# Patient Record
Sex: Female | Born: 1975 | Marital: Married | Smoking: Never smoker
Health system: Southern US, Community
[De-identification: ages and names within clinical notes are randomized; demographics above are authoritative.]

## PROBLEM LIST (undated history)

## (undated) DIAGNOSIS — M549 Dorsalgia, unspecified: Secondary | ICD-10-CM

## (undated) DIAGNOSIS — F32A Depression, unspecified: Secondary | ICD-10-CM

## (undated) DIAGNOSIS — R51 Headache: Secondary | ICD-10-CM

## (undated) DIAGNOSIS — R519 Headache, unspecified: Secondary | ICD-10-CM

## (undated) DIAGNOSIS — F329 Major depressive disorder, single episode, unspecified: Secondary | ICD-10-CM

## (undated) HISTORY — DX: Headache, unspecified: R51.9

## (undated) HISTORY — DX: Depression, unspecified: F32.A

## (undated) HISTORY — DX: Major depressive disorder, single episode, unspecified: F32.9

## (undated) HISTORY — DX: Dorsalgia, unspecified: M54.9

## (undated) HISTORY — PX: ABDOMINAL HYSTERECTOMY: SHX81

## (undated) HISTORY — DX: Headache: R51

---

## 2011-06-10 ENCOUNTER — Ambulatory Visit (INDEPENDENT_AMBULATORY_CARE_PROVIDER_SITE_OTHER): Payer: BC Managed Care – PPO | Admitting: Medical

## 2011-06-10 ENCOUNTER — Encounter: Payer: Self-pay | Admitting: Medical

## 2011-06-10 ENCOUNTER — Other Ambulatory Visit (HOSPITAL_COMMUNITY)
Admission: RE | Admit: 2011-06-10 | Discharge: 2011-06-10 | Disposition: A | Discharge status: Home / Self Care | Payer: BC Managed Care – PPO | Source: Ambulatory Visit | Attending: Family Medicine | Admitting: Family Medicine

## 2011-06-10 VITALS — BP 120/80 | HR 80 | Temp 98.8°F | Resp 16 | Ht <= 58 in | Wt 119.0 lb

## 2011-06-10 DIAGNOSIS — Z0000 Encounter for general adult medical examination without abnormal findings: Secondary | ICD-10-CM

## 2011-06-10 DIAGNOSIS — M549 Dorsalgia, unspecified: Secondary | ICD-10-CM

## 2011-06-10 DIAGNOSIS — Z113 Encounter for screening for infections with a predominantly sexual mode of transmission: Secondary | ICD-10-CM

## 2011-06-10 DIAGNOSIS — Z23 Encounter for immunization: Secondary | ICD-10-CM

## 2011-06-10 DIAGNOSIS — K029 Dental caries, unspecified: Secondary | ICD-10-CM | POA: Insufficient documentation

## 2011-06-10 DIAGNOSIS — Z01419 Encounter for gynecological examination (general) (routine) without abnormal findings: Secondary | ICD-10-CM | POA: Insufficient documentation

## 2011-06-10 DIAGNOSIS — Z124 Encounter for screening for malignant neoplasm of cervix: Secondary | ICD-10-CM

## 2011-06-10 LAB — POCT URINALYSIS DIPSTICK
Bilirubin, UA: NEGATIVE
Blood, UA: NEGATIVE
Nitrite, UA: NEGATIVE
Spec Grav, UA: <=1.005
Urobilinogen, UA: NEGATIVE
pH, UA: 7.5

## 2011-06-10 NOTE — Patient Instructions (Signed)
You were here today for a preventative care/primary care visit.  Your blood pressure was normal today.  We checked blood/labs today for cholesterol, thyroid, liver, kidney, blood counts, urine, pap smear for cervical cancer screening.  You will be due for a mammogram breast cancer screening at age 36 years old.  We will call you with lab results in a few days.  I recommend the following:  See a dentist soon for cavities and hygiene visit  Exercise most days of the week - walking, running, swimming, etc.  Eat a healthy low fat diet  Don't add salt to your food  Return yearly for a physical and preventative care  Check your breasts once monthly for lumps or things that don't seem normal  Consider taking a daily multivitamin  Try OTC Aleve twice daily as needed for back pain  Let me know if you have other questions

## 2011-06-11 ENCOUNTER — Encounter: Payer: Self-pay | Admitting: Medical

## 2011-06-11 LAB — LIPID PANEL
Cholesterol: 174 mg/dL (ref 0–200)
HDL: 47 mg/dL (ref >39)
LDL Cholesterol: 110 mg/dL — ABNORMAL HIGH (ref 0–99)
Total CHOL/HDL Ratio: 3.7 Ratio
Triglycerides: 87 mg/dL (ref <150)
VLDL: 17 mg/dL (ref 0–40)

## 2011-06-11 LAB — CBC WITH DIFFERENTIAL/PLATELET
Basophils Relative: 1 % (ref 0–1)
HCT: 40 % (ref 36.0–46.0)
Hemoglobin: 14 g/dL (ref 12.0–15.0)
Lymphocytes Relative: 25 % (ref 12–46)
Lymphs Abs: 2.6 10*3/uL (ref 0.7–4.0)
MCHC: 35 g/dL (ref 30.0–36.0)
Monocytes Absolute: 0.5 10*3/uL (ref 0.1–1.0)
Monocytes Relative: 5 % (ref 3–12)
Neutro Abs: 6.7 10*3/uL (ref 1.7–7.7)
Neutrophils Relative %: 64 % (ref 43–77)
RBC: 5.33 MIL/uL — ABNORMAL HIGH (ref 3.87–5.11)

## 2011-06-11 LAB — COMPREHENSIVE METABOLIC PANEL
AST: 15 U/L (ref 0–37)
Albumin: 4.4 g/dL (ref 3.5–5.2)
BUN: 9 mg/dL (ref 6–23)
Calcium: 9.3 mg/dL (ref 8.4–10.5)
Chloride: 103 mEq/L (ref 96–112)
Creat: 0.55 mg/dL (ref 0.50–1.10)
Glucose, Bld: 78 mg/dL (ref 70–99)
Potassium: 3.9 mEq/L (ref 3.5–5.3)

## 2011-06-11 LAB — GC/CHLAMYDIA PROBE AMP, URINE: Chlamydia, Swab/Urine, PCR: NEGATIVE

## 2011-06-11 NOTE — Progress Notes (Signed)
Subjective:   HPI  Gail Bruce is a 36 y.o. female who presents for a complete physical. She is a new patient today.  She is accompanied by 2 interpreters today.   One is a friend who performs translation services through their church.  Her name is Elenor Legato and was a Proofreader in [place].  The other is a female interpreter Tin Hwing also [place] and offers services through [employer].  Mrs. Hoagland does not speak english.  She is from [place], moved to [place] 5-6 years ago, and is not accustomed to primary/preventative care.  Back in [place], she only saw doctors if sick.  She had all of her children at home.  She has enjoyed good health. She did have a hysterectomy in the past, but thinks it was due to infection.  She has not seen a doctor since coming to the [place].  She had vaccines administered prior to immigrating to the [place].   Recent issues.  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  Hx/o chronic headaches, possibly migraines, but not been an issue of late.  She does note hx/o yeast infection, but no prior STDs.  She wants pap test, never had screening prior.  She did have elevated BP on a church sponsored health screen, but no hx/o HTN.  Reviewed their medical, surgical, family, social, medication, and allergy history and updated chart as appropriate.  Past Medical History  Diagnosis Date  . Chronic headache   . Back pain   . Depression     Past Surgical History  Procedure Date  . Abdominal hysterectomy     due to infection?    Family History  Problem Relation Age of Onset  . Other Mother     unknown  . Other Father     unknown    History   Social History  . Marital Status: Married    Spouse Name: N/A    Number of Children: N/A  . Years of Education: N/A   Occupational History  . homemaker    Social History Main Topics  . Smoking status: Never Smoker   . Smokeless tobacco: Not on file  . Alcohol Use: No  . Drug Use: No   . Sexually Active: Not on file   Other Topics Concern  . Not on file   Social History Narrative   Moved to [place] 2006?, husband works out of town, from [place], has 3 children ages 62yo, 11yo and 11 yo as of 05/2011.    No current outpatient prescriptions on file prior to visit.    No Known Allergies   Review of Systems Constitutional: denies fever, chills, sweats, unexpected weight change, anorexia, fatigue Allergy: negative; denies recent sneezing, itching, congestion Dermatology: denies changing moles, rash, lumps, new worrisome lesions ENT: no runny nose, ear pain, sore throat, hoarseness, sinus pain, teeth pain, tinnitus, hearing loss, epistaxis Cardiology: denies chest pain, palpitations, edema, orthopnea, paroxysmal nocturnal dyspnea Respiratory: denies cough, shortness of breath, dyspnea on exertion, wheezing, hemoptysis Gastroenterology: denies abdominal pain, nausea, vomiting, diarrhea, constipation, blood in stool, changes in bowel movement, dysphagia Hematology: denies bleeding or bruising problems Musculoskeletal: +intermittent low back pain; denies arthralgias, myalgias, joint swelling, neck pain, cramping, gait changes Ophthalmology: denies vision changes, eye redness, itching, discharge Urology: denies dysuria, difficulty urinating, hematuria, urinary frequency, urgency, incontinence Neurology: intermittent headache, but no weakness, tingling, numbness, speech abnormality, memory loss, falls, dizziness      Objective:   Physical Exam  Filed Vitals:   06/10/11 0922  BP: 120/80  Pulse: 80  Temp: 98.8 F (37.1 C)  Resp: 16    General appearance: alert, no distress, WD/WN, vietnamese female Skin: some mild solar keratosis of face, but otherwise no worrisome lesions HEENT: normocephalic, conjunctiva/corneas normal, sclerae anicteric, PERRLA, EOMi, nares patent, no discharge or erythema, pharynx normal Oral cavity: MMM, tongue normal, few upper and lower left  side molars in decay, moderate plaque and stain Neck: supple, no lymphadenopathy, no thyromegaly, no masses, normal ROM, no bruits Chest: non tender, normal shape and expansion Heart: RRR, normal S1, S2, no murmurs Lungs: CTA bilaterally, no wheezes, rhonchi, or rales Abdomen: +bs, soft, non tender, non distended, no masses, no hepatomegaly, no splenomegaly, no bruits Back: non tender, normal ROM, no scoliosis Musculoskeletal: upper extremities non tender, no obvious deformity, normal ROM throughout, lower extremities non tender, no obvious deformity, normal ROM throughout Extremities: no edema, no cyanosis, no clubbing Pulses: 2+ symmetric, upper and lower extremities, normal cap refill Neurological: alert, oriented x 3, CN2-12 intact, strength normal upper extremities and lower extremities, sensation normal throughout, DTRs 2+ throughout, no cerebellar signs, gait normal Psychiatric: normal affect, behavior normal, pleasant  Breast: nontender, no masses or lumps, no skin changes, no nipple discharge or inversion, no axillary lymphadenopathy Gyn: Normal external genitalia without lesions, vagina with normal mucosa, cervix without lesions, no cervical motion tenderness, no abnormal vaginal discharge.  Uterus and adnexa not enlarged, nontender, no masses.  Pap performed, swabs taken.  Assisted by PA student Aleya Hyderi.  Exam chaperoned by nurse. Rectal: deferred    Assessment and Plan :     Encounter Diagnoses  Name Primary?  . Routine general medical examination at a health care facility Yes  . Screening for cervical cancer   . Back pain   . Dental caries   . Screen for STD (sexually transmitted disease)   . Need for prophylactic vaccination and inoculation against influenza     Physical exam - discussed healthy lifestyle, diet, exercise, preventative care, vaccinations, and addressed their concerns.  Handout given.  Labs, pap, STD screening today.  Dental caries - advised she  establish with dentist soon.  Flu vaccine, vaccine counseling, and VIS today.  Back pain - advised stretching, routine exercise, Tylenol or Aleve OTC for pain.   She also gets massage for back pain.  At her request, we will mail lab results.

## 2011-06-16 ENCOUNTER — Encounter: Payer: Self-pay | Admitting: Medical

## 2014-08-21 ENCOUNTER — Ambulatory Visit (INDEPENDENT_AMBULATORY_CARE_PROVIDER_SITE_OTHER): Payer: 59 | Admitting: Urgent Care

## 2014-08-21 VITALS — BP 118/68 | HR 80 | Temp 98.5°F | Resp 18 | Ht <= 58 in | Wt 120.0 lb

## 2014-08-21 DIAGNOSIS — R319 Hematuria, unspecified: Secondary | ICD-10-CM | POA: Diagnosis not present

## 2014-08-21 DIAGNOSIS — R309 Painful micturition, unspecified: Secondary | ICD-10-CM | POA: Diagnosis not present

## 2014-08-21 DIAGNOSIS — R1030 Lower abdominal pain, unspecified: Secondary | ICD-10-CM

## 2014-08-21 DIAGNOSIS — N390 Urinary tract infection, site not specified: Secondary | ICD-10-CM | POA: Diagnosis not present

## 2014-08-21 LAB — POCT CBC
Granulocyte percent: 73.7 %G (ref 37–80)
HEMATOCRIT: 42.9 % (ref 37.7–47.9)
HEMOGLOBIN: 13.8 g/dL (ref 12.2–16.2)
LYMPH, POC: 2.4 (ref 0.6–3.4)
MCH: 25.6 pg — AB (ref 27–31.2)
MCHC: 32.2 g/dL (ref 31.8–35.4)
MCV: 79.5 fL — AB (ref 80–97)
MID (cbc): 0.7 (ref 0–0.9)
MPV: 7.1 fL (ref 0–99.8)
PLATELET COUNT, POC: 287 10*3/uL (ref 142–424)
POC GRANULOCYTE: 8.7 — AB (ref 2–6.9)
POC LYMPH PERCENT: 20.5 %L (ref 10–50)
POC MID %: 5.8 %M (ref 0–12)
RBC: 5.39 M/uL (ref 4.04–5.48)
RDW, POC: 15.6 %
WBC: 11.8 10*3/uL — AB (ref 4.6–10.2)

## 2014-08-21 LAB — POCT UA - MICROSCOPIC ONLY
CASTS, UR, LPF, POC: NEGATIVE
Crystals, Ur, HPF, POC: NEGATIVE
Mucus, UA: NEGATIVE
YEAST UA: NEGATIVE

## 2014-08-21 LAB — POCT URINALYSIS DIPSTICK
Bilirubin, UA: NEGATIVE
Glucose, UA: NEGATIVE
Ketones, UA: NEGATIVE
Nitrite, UA: NEGATIVE
Protein, UA: NEGATIVE
Spec Grav, UA: 1.02
Urobilinogen, UA: 0.2
pH, UA: 7

## 2014-08-21 LAB — COMPREHENSIVE METABOLIC PANEL
ALK PHOS: 51 U/L (ref 39–117)
AST: 12 U/L (ref 0–37)
Albumin: 4.2 g/dL (ref 3.5–5.2)
BILIRUBIN TOTAL: 0.5 mg/dL (ref 0.2–1.2)
BUN: 15 mg/dL (ref 6–23)
CO2: 25 mEq/L (ref 19–32)
Calcium: 9.2 mg/dL (ref 8.4–10.5)
Chloride: 102 mEq/L (ref 96–112)
Creat: 0.53 mg/dL (ref 0.50–1.10)
Glucose, Bld: 76 mg/dL (ref 70–99)
Potassium: 4.3 mEq/L (ref 3.5–5.3)
SODIUM: 134 meq/L — AB (ref 135–145)
Total Protein: 7.7 g/dL (ref 6.0–8.3)

## 2014-08-21 MED ORDER — PHENAZOPYRIDINE HCL 200 MG PO TABS: 200.0000 mg | ORAL_TABLET | Freq: Three times a day (TID) | ORAL | Status: DC | PRN

## 2014-08-21 MED ORDER — CIPROFLOXACIN HCL 500 MG PO TABS: 500.0000 mg | ORAL_TABLET | Freq: Two times a day (BID) | ORAL | Status: DC

## 2014-08-21 NOTE — Patient Instructions (Signed)
Nhi?m Trng ???ng Ti?t Ni?u (Urinary Tract Infection) Nhi?m trng ???ng ti?t ni?u (UTI) c th? pht tri?n ?? b?t c? vi? tri? na?o d?c ???ng ti?t ni?u. ???ng ti?t ni?u l h? th?ng thot n??c c?a c? th? ?? lo?i b? ch?t th?i v n??c d? th?a. ???ng ti?t ni?u bao g?m hai th?n, ni?u qu?n, bng quang v ni?u ??o. Th?n l m?t c?p c? quan hnh h?t ??u. M?i th?n co? kch th??c kho?ng b?ng bn tay c?a b?n. Chng n?m d??i x??ng s??n, m?i bn c?t s?ng c m?t qu?.  NGUYN NHN Nhi?m trng gy b?i vi sinh v?t, l cc sinh v?t nh?, bao g?m n?m, vi rt v vi khu?n. Nh?ng sinh v?t ny nh? t?i m?c chng ch? c th? ???c nhn th?y qua knh hi?n vi. Vi khu?n l nh?ng vi sinh v?t ph? bi?n nh?t gy ra UTI. TRI?U CH?NG Cc tri?u ch?ng c?a UTI c th? khc nhau, ty theo ?? tu?i v gi?i tnh c?a b?nh nhn v b?i v? tr nhi?m trng. Cc tri?u ch?ng ? ph? n? tr? th??ng bao g?m nhu c?u ti?u ti?n th??ng xuyn v d? d?i, c?m gic ?au v rt ? bng quang ho?c ni?u ??o khi ?i ti?u. Ph? n? v nam gi?i l?n tu?i c nhi?u kh? n?ng b? m?t m?i, run r?y v y?u, ??ng th?i b? ?au c? v ?au b?ng. S?t c th? c ngh?a l nhi?m trng ? th?n. Cc tri?u ch?ng khc c?a nhi?m trng th?n bao g?m ?au ? l?ng ho?c hai bn d??i x??ng s??n, bu?n nn v nn m?a. CH?N ?ON ?? ch?n ?on UTI, chuyn gia ch?m sc s?c kh?e s? h?i b?n v? nh?ng tri?u ch?ng c?a b?n. Chuyn gia ch?m sc s?c kh?e c?ng s? yu c?u cung c?p m?u n??c ti?u. M?u n??c ti?u s? ???c xt nghi?m xem c vi khu?n v cc t? bo mu tr?ng khng. Cc t? bo mu tr?ng ???c t?o b?i c? th? ?? gip ch?ng l?i nhi?m trng. ?I?U TR? Thng th??ng, UTI c th? ???c ?i?u tr? b?ng thu?c. B?i v h?u h?t nguyn nhn gy ra UTI l do nhi?m trng b?i vi khu?n, chng th??ng c th? ???c ?i?u tr? b?ng cch s? d?ng thu?c khng sinh. Vi?c l?a ch?n khng sinh v th?i gian ?i?u tr? ph? thu?c vo tri?u ch?ng v lo?i vi khu?n gy nhi?m trng. H??NG D?N CH?M SC T?I NH  N?u b?n ? ???c k khng sinh, hy s? d?ng chng ?ng  nh? chuyn gia ch?m sc s?c kh?e h??ng d?n. S? d?ng h?t thu?c ngay c? khi b?n c?m th?y kh h?n sau khi ch? dng m?t ph?n thu?c.  U?ng ?? n??c v dung d?ch ?? n??c ti?u trong ho?c c mu vng nh?t.  Trnh caffeine, tr v ?? u?ng c ga. Chng c xu h??ng kch thch bng quang.  ?i ti?u th??ng xuyn. Trnh nh?n ti?u trong th?i gian di.  ?i ti?u tr??c v sau khi quan h? tnh d?c.  Sau khi ?i ??i ti?n, ph? n? c?n lm s?ch t? tr??c ra sau. Ch? s? d?ng gi?y v? sinh m?t l?n. HY ?I KHM N?U:  B?n b? ?au l?ng.  B?n b? s?t.  Cc tri?u ch?ng c?a b?n khng b?t ??u ?? trong vng 3 ngy. HY NGAY L?P T?C ?I KHM N?U:  B?n b? ?au l?ng ho?c ?au b?ng d??i nghim tr?ng.  B?n b? ?n l?nh.  B?n b? bu?n nn ho?c nn m?a.  B?n lin t?c b? rt ho?c kh ch?u khi ?i ti?u. ??M   B?O B?N:  Hi?u cc h??ng d?n ny.  S? theo di tnh tr?ng c?a mnh.  S? yu c?u tr? gip ngay l?p t?c n?u b?n c?m th?y khng ?? ho?c tnh tr?ng tr?m tr?ng h?n. Document Released: 05/05/2005 Document Revised: 01/05/2013 ExitCare Patient Information 2015 [employer]. This information is not intended to replace advice given to you by your health care provider. Make sure you discuss any questions you have with your health care provider.  

## 2014-08-21 NOTE — Progress Notes (Signed)
MRN: [id number] DOB: 1975-10-12  Subjective:   Gail Bruce is a 39 y.o. female presenting for chief complaint of Abdominal Pain and Dysuria  Patient speaks [place] only, is accompanied by friend, Marvetta Gibbons, who is translating for patient. Reports 5 day history of pelvic pressure/pain, dysuria, low back pain, urinary frequency, urgency. Has not tried any medications for relief. Denies fevers, n/v, abdominal pain, flank pain, hematuria, cloudy urine, constipation. Denies smoking or alcohol use. Denies any other aggravating or relieving factors, no other questions or concerns.  Zoua currently has no medications in their medication list. She has No Known Allergies.  Kassidy  has a past medical history of Chronic headache; Back pain; and Depression. Also  has past surgical history that includes Abdominal hysterectomy and Cesarean section.  ROS As in subjective.  Objective:   Vitals: BP 118/68 mmHg  Pulse 80  Temp(Src) 98.5 F (36.9 C) (Oral)  Resp 18  Ht  (1.422 m)  Wt 120 lb (54.432 kg)  BMI 26.92 kg/m2  SpO2 99%  Physical Exam  Constitutional: She is oriented to person, place, and time and well-developed, well-nourished, and in no distress.  Cardiovascular: Normal rate, regular rhythm and intact distal pulses.  Exam reveals no gallop and no friction rub.   No murmur heard. Pulmonary/Chest: No respiratory distress. She has no wheezes. She has no rales. She exhibits no tenderness.  Abdominal: Soft. Bowel sounds are normal. She exhibits no distension and no mass. There is tenderness (lower abdominal with deep palpation). There is no rebound and no guarding.  Neurological: She is alert and oriented to person, place, and time.  Skin: Skin is warm and dry. No rash noted. No erythema. No pallor.       Results for orders placed or performed in visit on 08/21/14 (from the past 24 hour(s))  POCT urinalysis dipstick     Status: None   Collection Time: 08/21/14  1:41 PM  Result  Value Ref Range   Color, UA yellow    Clarity, UA clear    Glucose, UA neg    Bilirubin, UA neg    Ketones, UA neg    Spec Grav, UA 1.020    Blood, UA large    pH, UA 7.0    Protein, UA neg    Urobilinogen, UA 0.2    Nitrite, UA neg    Leukocytes, UA Trace   POCT UA - Microscopic Only     Status: None   Collection Time: 08/21/14  1:41 PM  Result Value Ref Range   WBC, Ur, HPF, POC 15-20    RBC, urine, microscopic tntc    Bacteria, U Microscopic small    Mucus, UA neg    Epithelial cells, urine per micros 5-10    Crystals, Ur, HPF, POC neg    Casts, Ur, LPF, POC neg    Yeast, UA neg   POCT CBC     Status: Abnormal   Collection Time: 08/21/14  2:27 PM  Result Value Ref Range   WBC 11.8 (A) 4.6 - 10.2 K/uL   Lymph, poc 2.4 0.6 - 3.4   POC LYMPH PERCENT 20.5 10 - 50 %L   MID (cbc) 0.7 0 - 0.9   POC MID % 5.8 0 - 12 %M   POC Granulocyte 8.7 (A) 2 - 6.9   Granulocyte percent 73.7 37 - 80 %G   RBC 5.39 4.04 - 5.48 M/uL   Hemoglobin 13.8 12.2 - 16.2 g/dL   HCT,  POC 42.9 37.7 - 47.9 %   MCV 79.5 (A) 80 - 97 fL   MCH, POC 25.6 (A) 27 - 31.2 pg   MCHC 32.2 31.8 - 35.4 g/dL   RDW, POC 16.115.6 %   Platelet Count, POC 287 142 - 424 K/uL   MPV 7.1 0 - 99.8 fL   Assessment and Plan :   1. Pain with urination 2. Urinary tract infection with hematuria, site unspecified 3. Lower abdominal pain - Start Ciprofloxacin x10 days, pyridium for dysuria, aggressive hydration - Return to clinic if symptoms worsen or fail to resolve in 1 week, otherwise will follow up with urine culture results.  Wallis BambergMario Dezmond Downie, PA-C [facility] [phone] 08/21/2014 1:41 PM

## 2014-08-24 ENCOUNTER — Encounter: Payer: Self-pay | Admitting: Urgent Care

## 2014-08-24 LAB — URINE CULTURE

## 2018-12-17 ENCOUNTER — Other Ambulatory Visit: Payer: Self-pay

## 2018-12-17 DIAGNOSIS — Z20822 Contact with and (suspected) exposure to covid-19: Secondary | ICD-10-CM

## 2018-12-19 LAB — NOVEL CORONAVIRUS, NAA: SARS-CoV-2, NAA: NOT DETECTED

## 2019-03-16 ENCOUNTER — Ambulatory Visit (INDEPENDENT_AMBULATORY_CARE_PROVIDER_SITE_OTHER): Payer: 59 | Admitting: Emergency Medicine

## 2019-03-16 ENCOUNTER — Ambulatory Visit (INDEPENDENT_AMBULATORY_CARE_PROVIDER_SITE_OTHER): Payer: 59

## 2019-03-16 ENCOUNTER — Other Ambulatory Visit: Payer: Self-pay

## 2019-03-16 ENCOUNTER — Encounter: Payer: Self-pay | Admitting: Emergency Medicine

## 2019-03-16 VITALS — BP 144/93 | HR 80 | Temp 98.1°F | Resp 16 | Ht <= 58 in | Wt 120.8 lb

## 2019-03-16 DIAGNOSIS — Z23 Encounter for immunization: Secondary | ICD-10-CM | POA: Diagnosis not present

## 2019-03-16 DIAGNOSIS — R102 Pelvic and perineal pain: Secondary | ICD-10-CM | POA: Diagnosis not present

## 2019-03-16 DIAGNOSIS — M62838 Other muscle spasm: Secondary | ICD-10-CM | POA: Diagnosis not present

## 2019-03-16 DIAGNOSIS — M7918 Myalgia, other site: Secondary | ICD-10-CM | POA: Diagnosis not present

## 2019-03-16 DIAGNOSIS — M5442 Lumbago with sciatica, left side: Secondary | ICD-10-CM

## 2019-03-16 LAB — POC MICROSCOPIC URINALYSIS (UMFC): Mucus: ABSENT

## 2019-03-16 LAB — POCT URINALYSIS DIP (MANUAL ENTRY)
Bilirubin, UA: NEGATIVE
Blood, UA: NEGATIVE
Glucose, UA: NEGATIVE mg/dL
Ketones, POC UA: NEGATIVE mg/dL
Leukocytes, UA: NEGATIVE
Nitrite, UA: NEGATIVE
Protein Ur, POC: NEGATIVE mg/dL
Spec Grav, UA: 1.025 (ref 1.010–1.025)
Urobilinogen, UA: 0.2 E.U./dL
pH, UA: 7 (ref 5.0–8.0)

## 2019-03-16 LAB — POCT URINE PREGNANCY: Preg Test, Ur: NEGATIVE

## 2019-03-16 MED ORDER — MELOXICAM 7.5 MG PO TABS: 7.5000 mg | ORAL_TABLET | Freq: Every day | ORAL | 1 refills | Status: AC

## 2019-03-16 MED ORDER — CYCLOBENZAPRINE HCL 10 MG PO TABS: 10.0000 mg | ORAL_TABLET | Freq: Every day | ORAL | 0 refills | Status: DC

## 2019-03-16 NOTE — Addendum Note (Signed)
Addended by: Kittie Plater, Jasmia Angst HUA on: 03/16/2019 11:49 AM   Modules accepted: Orders

## 2019-03-16 NOTE — Patient Instructions (Addendum)
   If you have lab work done today you will be contacted with your lab results within the next 2 weeks.  If you have not heard from us then please contact us. The fastest way to get your results is to register for My Chart.   IF you received an x-ray today, you will receive an invoice from [facility]. Please contact [facility] at [phone] with questions or concerns regarding your invoice.   IF you received labwork today, you will receive an invoice from [facility]. Please contact [facility] at [phone] with questions or concerns regarding your invoice.   Our billing staff will not be able to assist you with questions regarding bills from these companies.  You will be contacted with the lab results as soon as they are available. The fastest way to get your results is to activate your My Chart account. Instructions are located on the last page of this paperwork. If you have not heard from us regarding the results in 2 weeks, please contact this office.     Sciatica  Sciatica is pain, weakness, tingling, or loss of feeling (numbness) along the sciatic nerve. The sciatic nerve starts in the lower back and goes down the back of each leg. Sciatica usually goes away on its own or with treatment. Sometimes, sciatica may come back (recur). What are the causes? This condition happens when the sciatic nerve is pinched or has pressure put on it. This may be the result of:  A disk in between the bones of the spine bulging out too far (herniated disk).  Changes in the spinal disks that occur with aging.  A condition that affects a muscle in the butt.  Extra bone growth near the sciatic nerve.  A break (fracture) of the area between your hip bones (pelvis).  Pregnancy.  Tumor. This is rare. What increases the risk? You are more likely to develop this condition if you:  Play sports that put pressure or stress on the spine.  Have poor strength and ease of movement  (flexibility).  Have had a back injury in the past.  Have had back surgery.  Sit for long periods of time.  Do activities that involve bending or lifting over and over again.  Are very overweight (obese). What are the signs or symptoms? Symptoms can vary from mild to very bad. They may include:  Any of these problems in the lower back, leg, hip, or butt: ? Mild tingling, loss of feeling, or dull aches. ? Burning sensations. ? Sharp pains.  Loss of feeling in the back of the calf or the sole of the foot.  Leg weakness.  Very bad back pain that makes it hard to move. These symptoms may get worse when you cough, sneeze, or laugh. They may also get worse when you sit or stand for long periods of time. How is this treated? This condition often gets better without any treatment. However, treatment may include:  Changing or cutting back on physical activity when you have pain.  Doing exercises and stretching.  Putting ice or heat on the affected area.  Medicines that help: ? To relieve pain and swelling. ? To relax your muscles.  Shots (injections) of medicines that help to relieve pain, irritation, and swelling.  Surgery. Follow these instructions at home: Medicines  Take over-the-counter and prescription medicines only as told by your doctor.  Ask your doctor if the medicine prescribed to you: ? Requires you to avoid driving or using   heavy machinery. ? Can cause trouble pooping (constipation). You may need to take these steps to prevent or treat trouble pooping:  Drink enough fluids to keep your pee (urine) pale yellow.  Take over-the-counter or prescription medicines.  Eat foods that are high in fiber. These include beans, whole grains, and fresh fruits and vegetables.  Limit foods that are high in fat and sugar. These include fried or sweet foods. Managing pain      If told, put ice on the affected area. ? Put ice in a plastic bag. ? Place a towel between  your skin and the bag. ? Leave the ice on for 20 minutes, 2-3 times a day.  If told, put heat on the affected area. Use the heat source that your doctor tells you to use, such as a moist heat pack or a heating pad. ? Place a towel between your skin and the heat source. ? Leave the heat on for 20-30 minutes. ? Remove the heat if your skin turns bright red. This is very important if you are unable to feel pain, heat, or cold. You may have a greater risk of getting burned. Activity   Return to your normal activities as told by your doctor. Ask your doctor what activities are safe for you.  Avoid activities that make your symptoms worse.  Take short rests during the day. ? When you rest for a long time, do some physical activity or stretching between periods of rest. ? Avoid sitting for a long time without moving. Get up and move around at least one time each hour.  Exercise and stretch regularly, as told by your doctor.  Do not lift anything that is heavier than 10 lb (4.5 kg) while you have symptoms of sciatica. ? Avoid lifting heavy things even when you do not have symptoms. ? Avoid lifting heavy things over and over.  When you lift objects, always lift in a way that is safe for your body. To do this, you should: ? Bend your knees. ? Keep the object close to your body. ? Avoid twisting. General instructions  Stay at a healthy weight.  Wear comfortable shoes that support your feet. Avoid wearing high heels.  Avoid sleeping on a mattress that is too soft or too hard. You might have less pain if you sleep on a mattress that is firm enough to support your back.  Keep all follow-up visits as told by your doctor. This is important. Contact a doctor if:  You have pain that: ? Wakes you up when you are sleeping. ? Gets worse when you lie down. ? Is worse than the pain you have had in the past. ? Lasts longer than 4 weeks.  You lose weight without trying. Get help right away  if:  You cannot control when you pee (urinate) or poop (have a bowel movement).  You have weakness in any of these areas and it gets worse: ? Lower back. ? The area between your hip bones. ? Butt. ? Legs.  You have redness or swelling of your back.  You have a burning feeling when you pee. Summary  Sciatica is pain, weakness, tingling, or loss of feeling (numbness) along the sciatic nerve.  This condition happens when the sciatic nerve is pinched or has pressure put on it.  Sciatica can cause pain, tingling, or loss of feeling (numbness) in the lower back, legs, hips, and butt.  Treatment often includes rest, exercise, medicines, and putting ice or heat   on the affected area. This information is not intended to replace advice given to you by your health care provider. Make sure you discuss any questions you have with your health care provider. Document Released: 02/12/2008 Document Revised: 05/24/2018 Document Reviewed: 05/24/2018 Elsevier Patient Education  2020 [employer]  

## 2019-03-16 NOTE — Progress Notes (Signed)
Gail Bruce 43 y.o.   Chief Complaint  Patient presents with  . Back Pain    lower radiate to abdominal x 1 week  . Leg Pain    both with numbness    HISTORY OF PRESENT ILLNESS: This is a 43 y.o. female complaining of lumbar sharp pain for 1 week radiating to groin area and down the left leg with occasional numbness.  Denies injury.  Denies bowel or bladder issues.  No associated symptoms. No significant past medical history.  Back Pain This is a new problem. The current episode started in the past 7 days. The problem occurs constantly. The problem has been waxing and waning since onset. The pain is present in the lumbar spine. The quality of the pain is described as aching. The pain radiates to the left knee and left foot. The pain is at a severity of 7/10. The pain is moderate. The pain is the same all the time. The symptoms are aggravated by bending, position, twisting and sitting. Associated symptoms include abdominal pain (Groin pain). Pertinent negatives include no bladder incontinence, bowel incontinence, chest pain, dysuria, fever, headaches, pelvic pain, weakness or weight loss. Risk factors: None. She has tried nothing for the symptoms.     Prior to Admission medications   Medication Sig Start Date End Date Taking? Authorizing Provider  Acetaminophen (TYLENOL PO) Take by mouth as needed.   Yes [provider]  ciprofloxacin (CIPRO) 500 MG tablet Take 1 tablet (500 mg total) by mouth 2 (two) times daily. Patient not taking: Reported on 03/16/2019 08/21/14   Wallis Bamberg, PA-C  phenazopyridine (PYRIDIUM) 200 MG tablet Take 1 tablet (200 mg total) by mouth 3 (three) times daily as needed for pain. Patient not taking: Reported on 03/16/2019 08/21/14   Wallis Bamberg, PA-C    No Known Allergies  There are no active problems to display for this patient.   Past Medical History:  Diagnosis Date  . Back pain   . Chronic headache   . Depression     Past Surgical History:   Procedure Laterality Date  . ABDOMINAL HYSTERECTOMY     due to infection?  . CESAREAN SECTION      Social History   Socioeconomic History  . Marital status: Married    Spouse name: Not on file  . Number of children: Not on file  . Years of education: Not on file  . Highest education level: Not on file  Occupational History  . Occupation: homemaker  Social Needs  . Financial resource strain: Not on file  . Food insecurity    Worry: Not on file    Inability: Not on file  . Transportation needs    Medical: Not on file    Non-medical: Not on file  Tobacco Use  . Smoking status: Never Smoker  . Smokeless tobacco: Never Used  Substance and Sexual Activity  . Alcohol use: No  . Drug use: No  . Sexual activity: Not on file  Lifestyle  . Physical activity    Days per week: Not on file    Minutes per session: Not on file  . Stress: Not on file  Relationships  . Social Musician on phone: Not on file    Gets together: Not on file    Attends religious service: Not on file    Active member of club or organization: Not on file    Attends meetings of clubs or organizations: Not on file  Relationship status: Not on file  . Intimate partner violence    Fear of current or ex partner: Not on file    Emotionally abused: Not on file    Physically abused: Not on file    Forced sexual activity: Not on file  Other Topics Concern  . Not on file  Social History Narrative   Moved to [place] 2006?, husband works out of town, from [place], has 3 children ages 65yo, 9yo and 13 yo as of 05/2011.    Family History  Problem Relation Age of Onset  . Other Mother        unknown  . Other Father        unknown     Review of Systems  Constitutional: Negative.  Negative for chills, fever and weight loss.  HENT: Negative.  Negative for congestion and sore throat.   Respiratory: Negative.  Negative for cough and shortness of breath.   Cardiovascular: Negative.  Negative for chest  pain and palpitations.  Gastrointestinal: Positive for abdominal pain (Groin pain). Negative for blood in stool, bowel incontinence, nausea and vomiting.  Genitourinary: Negative.  Negative for bladder incontinence, dysuria, frequency, hematuria, pelvic pain and urgency.  Musculoskeletal: Positive for back pain.  Skin: Negative.  Negative for rash.  Neurological: Negative for dizziness, weakness and headaches.  All other systems reviewed and are negative.  Vitals:   03/16/19 1023  BP: (!) 144/93  Pulse: 80  Resp: 16  Temp: 98.1 F (36.7 C)  SpO2: 97%     Physical Exam Vitals signs reviewed.  Constitutional:      Appearance: Normal appearance.  HENT:     Head: Normocephalic.  Eyes:     Extraocular Movements: Extraocular movements intact.     Pupils: Pupils are equal, round, and reactive to light.  Neck:     Musculoskeletal: Normal range of motion and neck supple.  Cardiovascular:     Rate and Rhythm: Normal rate and regular rhythm.     Pulses: Normal pulses.     Heart sounds: Normal heart sounds.  Pulmonary:     Effort: Pulmonary effort is normal.     Breath sounds: Normal breath sounds.  Abdominal:     General: There is no distension.     Palpations: Abdomen is soft. There is no mass.     Tenderness: There is no abdominal tenderness. There is no right CVA tenderness or left CVA tenderness.  Musculoskeletal:        General: No swelling or tenderness.     Lumbar back: She exhibits pain and spasm. She exhibits no bony tenderness, no swelling and normal pulse.     Right lower leg: No edema.     Left lower leg: No edema.  Skin:    General: Skin is warm and dry.     Capillary Refill: Capillary refill takes less than 2 seconds.  Neurological:     General: No focal deficit present.     Mental Status: She is alert and oriented to person, place, and time.     Sensory: No sensory deficit.     Motor: No weakness.     Coordination: Coordination normal.     Gait: Gait normal.      Deep Tendon Reflexes: Reflexes normal.  Psychiatric:        Mood and Affect: Mood normal.        Behavior: Behavior normal.    Results for orders placed or performed in visit on 03/16/19 (from the past  24 hour(s))  POCT urinalysis dipstick     Status: None   Collection Time: 03/16/19 10:42 AM  Result Value Ref Range   Color, UA yellow yellow   Clarity, UA clear clear   Glucose, UA negative negative mg/dL   Bilirubin, UA negative negative   Ketones, POC UA negative negative mg/dL   Spec Grav, UA 2.409 7.353 - 1.025   Blood, UA negative negative   pH, UA 7.0 5.0 - 8.0   Protein Ur, POC negative negative mg/dL   Urobilinogen, UA 0.2 0.2 or 1.0 E.U./dL   Nitrite, UA Negative Negative   Leukocytes, UA Negative Negative  POCT Microscopic Urinalysis ([facility])     Status: Abnormal   Collection Time: 03/16/19 10:53 AM  Result Value Ref Range   WBC,UR,HPF,POC None None WBC/hpf   RBC,UR,HPF,POC None None RBC/hpf   Bacteria None None, Too numerous to count   Mucus Absent Absent   Epithelial Cells, UR Per Microscopy Moderate (A) None, Too numerous to count cells/hpf  POCT urine pregnancy     Status: None   Collection Time: 03/16/19 10:53 AM  Result Value Ref Range   Preg Test, Ur Negative Negative   Dg Lumbar Spine 2-3 Views  Result Date: 03/16/2019 CLINICAL DATA:  Acute left low back pain and left sciatica EXAM: LUMBAR SPINE - 2-3 VIEW COMPARISON:  None. FINDINGS: Vertebral body heights alignment are maintained. Mild disc height loss at L5-S1. Minimal endplate spurring. Minor lower lumbar facet hypertrophy. IMPRESSION: Minimal degenerative changes.  Otherwise unremarkable study. Electronically Signed   By: Guadlupe Spanish M.D.   On: 03/16/2019 11:06     ASSESSMENT & PLAN: Daphine was seen today for back pain and leg pain.  Diagnoses and all orders for this visit:  Acute left-sided low back pain with left-sided sciatica -     POCT Microscopic Urinalysis ([facility]) -     POCT urinalysis  dipstick -     DG Lumbar Spine 2-3 Views; Future  Pelvic pain -     POCT urine pregnancy  Musculoskeletal pain -     meloxicam (MOBIC) 7.5 MG tablet; Take 1 tablet (7.5 mg total) by mouth daily for 10 days.  Muscle spasm -     cyclobenzaprine (FLEXERIL) 10 MG tablet; Take 1 tablet (10 mg total) by mouth at bedtime.    Patient Instructions       If you have lab work done today you will be contacted with your lab results within the next 2 weeks.  If you have not heard from [place] then please contact us. The fastest way to get your results is to register for My Chart.   IF you received an x-ray today, you will receive an invoice from [facility]. Please contact [facility] at [phone] with questions or concerns regarding your invoice.   IF you received labwork today, you will receive an invoice from [facility]. Please contact [facility] at [phone] with questions or concerns regarding your invoice.   Our billing staff will not be able to assist you with questions regarding bills from these companies.  You will be contacted with the lab results as soon as they are available. The fastest way to get your results is to activate your My Chart account. Instructions are located on the last page of this paperwork. If you have not heard from [place] regarding the results in 2 weeks, please contact this office.     Sciatica  Sciatica is pain, weakness, tingling, or loss of feeling (numbness)  along the sciatic nerve. The sciatic nerve starts in the lower back and goes down the back of each leg. Sciatica usually goes away on its own or with treatment. Sometimes, sciatica may come back (recur). What are the causes? This condition happens when the sciatic nerve is pinched or has pressure put on it. This may be the result of:  A disk in between the bones of the spine bulging out too far (herniated disk).  Changes in the spinal disks that occur with aging.  A condition that  affects a muscle in the butt.  Extra bone growth near the sciatic nerve.  A break (fracture) of the area between your hip bones (pelvis).  Pregnancy.  Tumor. This is rare. What increases the risk? You are more likely to develop this condition if you:  Play sports that put pressure or stress on the spine.  Have poor strength and ease of movement (flexibility).  Have had a back injury in the past.  Have had back surgery.  Sit for long periods of time.  Do activities that involve bending or lifting over and over again.  Are very overweight (obese). What are the signs or symptoms? Symptoms can vary from mild to very bad. They may include:  Any of these problems in the lower back, leg, hip, or butt: ? Mild tingling, loss of feeling, or dull aches. ? Burning sensations. ? Sharp pains.  Loss of feeling in the back of the calf or the sole of the foot.  Leg weakness.  Very bad back pain that makes it hard to move. These symptoms may get worse when you cough, sneeze, or laugh. They may also get worse when you sit or stand for long periods of time. How is this treated? This condition often gets better without any treatment. However, treatment may include:  Changing or cutting back on physical activity when you have pain.  Doing exercises and stretching.  Putting ice or heat on the affected area.  Medicines that help: ? To relieve pain and swelling. ? To relax your muscles.  Shots (injections) of medicines that help to relieve pain, irritation, and swelling.  Surgery. Follow these instructions at home: Medicines  Take over-the-counter and prescription medicines only as told by your doctor.  Ask your doctor if the medicine prescribed to you: ? Requires you to avoid driving or using heavy machinery. ? Can cause trouble pooping (constipation). You may need to take these steps to prevent or treat trouble pooping:  Drink enough fluids to keep your pee (urine) pale  yellow.  Take over-the-counter or prescription medicines.  Eat foods that are high in fiber. These include beans, whole grains, and fresh fruits and vegetables.  Limit foods that are high in fat and sugar. These include fried or sweet foods. Managing pain      If told, put ice on the affected area. ? Put ice in a plastic bag. ? Place a towel between your skin and the bag. ? Leave the ice on for 20 minutes, 2-3 times a day.  If told, put heat on the affected area. Use the heat source that your doctor tells you to use, such as a moist heat pack or a heating pad. ? Place a towel between your skin and the heat source. ? Leave the heat on for 20-30 minutes. ? Remove the heat if your skin turns bright red. This is very important if you are unable to feel pain, heat, or cold. You may have a  greater risk of getting burned. Activity   Return to your normal activities as told by your doctor. Ask your doctor what activities are safe for you.  Avoid activities that make your symptoms worse.  Take short rests during the day. ? When you rest for a long time, do some physical activity or stretching between periods of rest. ? Avoid sitting for a long time without moving. Get up and move around at least one time each hour.  Exercise and stretch regularly, as told by your doctor.  Do not lift anything that is heavier than 10 lb (4.5 kg) while you have symptoms of sciatica. ? Avoid lifting heavy things even when you do not have symptoms. ? Avoid lifting heavy things over and over.  When you lift objects, always lift in a way that is safe for your body. To do this, you should: ? Bend your knees. ? Keep the object close to your body. ? Avoid twisting. General instructions  Stay at a healthy weight.  Wear comfortable shoes that support your feet. Avoid wearing high heels.  Avoid sleeping on a mattress that is too soft or too hard. You might have less pain if you sleep on a mattress that is  firm enough to support your back.  Keep all follow-up visits as told by your doctor. This is important. Contact a doctor if:  You have pain that: ? Wakes you up when you are sleeping. ? Gets worse when you lie down. ? Is worse than the pain you have had in the past. ? Lasts longer than 4 weeks.  You lose weight without trying. Get help right away if:  You cannot control when you pee (urinate) or poop (have a bowel movement).  You have weakness in any of these areas and it gets worse: ? Lower back. ? The area between your hip bones. ? Butt. ? Legs.  You have redness or swelling of your back.  You have a burning feeling when you pee. Summary  Sciatica is pain, weakness, tingling, or loss of feeling (numbness) along the sciatic nerve.  This condition happens when the sciatic nerve is pinched or has pressure put on it.  Sciatica can cause pain, tingling, or loss of feeling (numbness) in the lower back, legs, hips, and butt.  Treatment often includes rest, exercise, medicines, and putting ice or heat on the affected area. This information is not intended to replace advice given to you by your health care provider. Make sure you discuss any questions you have with your health care provider. Document Released: 02/12/2008 Document Revised: 05/24/2018 Document Reviewed: 05/24/2018 Elsevier Patient Education  2020 Elsevier Inc.      Edwina Barth, MD [facility]

## 2019-10-04 ENCOUNTER — Ambulatory Visit (HOSPITAL_COMMUNITY)
Admission: EM | Admit: 2019-10-04 | Discharge: 2019-10-04 | Disposition: A | Discharge status: Home / Self Care | Payer: Self-pay | Attending: Family Medicine | Admitting: Family Medicine

## 2019-10-04 ENCOUNTER — Encounter (HOSPITAL_COMMUNITY): Payer: Self-pay

## 2019-10-04 ENCOUNTER — Other Ambulatory Visit: Payer: Self-pay

## 2019-10-04 DIAGNOSIS — Z20822 Contact with and (suspected) exposure to covid-19: Secondary | ICD-10-CM | POA: Insufficient documentation

## 2019-10-04 DIAGNOSIS — E860 Dehydration: Secondary | ICD-10-CM | POA: Insufficient documentation

## 2019-10-04 DIAGNOSIS — Z90710 Acquired absence of both cervix and uterus: Secondary | ICD-10-CM | POA: Insufficient documentation

## 2019-10-04 DIAGNOSIS — R000 Tachycardia, unspecified: Secondary | ICD-10-CM | POA: Insufficient documentation

## 2019-10-04 DIAGNOSIS — N23 Unspecified renal colic: Secondary | ICD-10-CM | POA: Insufficient documentation

## 2019-10-04 DIAGNOSIS — R1084 Generalized abdominal pain: Secondary | ICD-10-CM | POA: Insufficient documentation

## 2019-10-04 DIAGNOSIS — R519 Headache, unspecified: Secondary | ICD-10-CM

## 2019-10-04 DIAGNOSIS — R509 Fever, unspecified: Secondary | ICD-10-CM | POA: Insufficient documentation

## 2019-10-04 DIAGNOSIS — R319 Hematuria, unspecified: Secondary | ICD-10-CM

## 2019-10-04 LAB — POCT URINALYSIS DIP (DEVICE)
Glucose, UA: NEGATIVE mg/dL
Ketones, ur: NEGATIVE mg/dL
Leukocytes,Ua: NEGATIVE
Nitrite: NEGATIVE
Protein, ur: 100 mg/dL — AB
Specific Gravity, Urine: 1.015 (ref 1.005–1.030)
Urobilinogen, UA: 0.2 mg/dL (ref 0.0–1.0)
pH: 6.5 (ref 5.0–8.0)

## 2019-10-04 NOTE — Discharge Instructions (Addendum)
Hydrate well with at least 2 liters (64 ounces) of water daily. Please just use Tylenol at a dose of 500mg -650mg  once every 6 hours as needed for your abdominal pain. Do not use any nonsteroidal anti-inflammatories (NSAIDs) like ibuprofen, Motrin, naproxen, Aleve, etc. which are all available over-the-counter.

## 2019-10-04 NOTE — ED Provider Notes (Signed)
[facility]   MRN: [id number] DOB: May 14, 1976  Subjective:   Gail Bruce is a 44 y.o. female presenting for 3 day hx acute onset of mid abdominal pain, moderate in severity. Has also had frontal headaches, fever, chills. Denies n/v, diarrhea, chest pain, shob, cough, throat pain, sinus congestion. Has a hysterectomy. Denies known hx of kidney stones. Does not hydrate well with water, drinks a little bit ~1 cup per day. Denies hx of UTIs. Does not drink coffee or tea. No alcohol.   No current facility-administered medications for this encounter.  Current Outpatient Medications:  .  Acetaminophen (TYLENOL PO), Take by mouth as needed., Disp: , Rfl:    No Known Allergies  Past Medical History:  Diagnosis Date  . Back pain   . Chronic headache   . Depression      Past Surgical History:  Procedure Laterality Date  . ABDOMINAL HYSTERECTOMY     due to infection?  . CESAREAN SECTION      Family History  Problem Relation Age of Onset  . Other Mother        unknown  . Other Father        unknown    Social History   Tobacco Use  . Smoking status: Never Smoker  . Smokeless tobacco: Never Used  Substance Use Topics  . Alcohol use: No  . Drug use: No    ROS   Objective:   Vitals: BP (!) 140/98   Pulse (!) 102   Temp (!) 97.5 F (36.4 C) (Oral)   Resp 16   SpO2 99%   Pulse fluctuated between 110-116 on recheck by PA-Mani.   Physical Exam Constitutional:      General: She is not in acute distress.    Appearance: Normal appearance. She is well-developed and normal weight. She is not ill-appearing, toxic-appearing or diaphoretic.  HENT:     Head: Normocephalic and atraumatic.     Right Ear: External ear normal.     Left Ear: External ear normal.     Nose: Nose normal.     Mouth/Throat:     Mouth: Mucous membranes are moist.     Pharynx: Oropharynx is clear.  Eyes:     General: No scleral icterus.    Extraocular Movements: Extraocular movements  intact.     Pupils: Pupils are equal, round, and reactive to light.  Cardiovascular:     Rate and Rhythm: Normal rate and regular rhythm.     Pulses: Normal pulses.     Heart sounds: Normal heart sounds. No murmur. No friction rub. No gallop.   Pulmonary:     Effort: Pulmonary effort is normal. No respiratory distress.     Breath sounds: Normal breath sounds. No stridor. No wheezing, rhonchi or rales.  Abdominal:     General: Bowel sounds are normal. There is no distension.     Palpations: Abdomen is soft. There is no mass.     Tenderness: There is generalized abdominal tenderness and tenderness in the epigastric area, periumbilical area and suprapubic area. There is no right CVA tenderness, left CVA tenderness, guarding or rebound.  Skin:    General: Skin is warm and dry.     Coloration: Skin is not pale.     Findings: No rash.  Neurological:     General: No focal deficit present.     Mental Status: She is alert and oriented to person, place, and time.  Psychiatric:  Mood and Affect: Mood normal.        Behavior: Behavior normal.        Thought Content: Thought content normal.        Judgment: Judgment normal.    Results for orders placed or performed during the hospital encounter of 10/04/19 (from the past 24 hour(s))  POCT urinalysis dip (device)     Status: Abnormal   Collection Time: 10/04/19  1:35 PM  Result Value Ref Range   Glucose, UA NEGATIVE NEGATIVE mg/dL   Bilirubin Urine SMALL (A) NEGATIVE   Ketones, ur NEGATIVE NEGATIVE mg/dL   Specific Gravity, Urine 1.015 1.005 - 1.030   Hgb urine dipstick LARGE (A) NEGATIVE   pH 6.5 5.0 - 8.0   Protein, ur 100 (A) NEGATIVE mg/dL   Urobilinogen, UA 0.2 0.0 - 1.0 mg/dL   Nitrite NEGATIVE NEGATIVE   Leukocytes,Ua NEGATIVE NEGATIVE   ED ECG REPORT   Date: 10/04/2019  Rate: 94bpm  Rhythm: normal sinus rhythm  QRS Axis: normal  Intervals: normal  ST/T Wave abnormalities: nonspecific T wave changes  Conduction  Disutrbances:none  Narrative Interpretation: Nonspecific T wave flattening in leads II, 3, aVF, V4, V5, V6.  Otherwise sinus rhythm at 94 bpm, no previous EKG available for comparison.  Old EKG Reviewed: none available  I have personally reviewed the EKG tracing and agree with the computerized printout as noted.   Assessment and Plan :   PDMP not reviewed this encounter.  1. Generalized abdominal pain   2. Frontal headache   3. Tachycardia   4. Dehydration     EKG was obtained due to abdominal pain and tachycardia which was not seen on the EKG.  Patient has minimal cardiac risk factors.  Will manage for renal colic, recommended scheduling Tylenol and starting to hydrate much better with water.  There is a significant language barrier but ultimately patient understood treatment plan. Counseled patient on potential for adverse effects with medications prescribed/recommended today, ER and return-to-clinic precautions discussed, patient verbalized understanding.    Wallis Bamberg, [place] 10/04/19 1445

## 2019-10-04 NOTE — ED Notes (Signed)
I used interpreter pad to triage pt

## 2019-10-04 NOTE — ED Triage Notes (Signed)
Pt c/o 4/10 mid abdominal pain. Pt denies N/V/D. Last BM  Was today. Pt c/o HA.

## 2019-10-05 LAB — SARS CORONAVIRUS 2 (TAT 6-24 HRS): SARS Coronavirus 2: NEGATIVE

## 2021-01-16 ENCOUNTER — Ambulatory Visit (HOSPITAL_COMMUNITY)
Admission: EM | Admit: 2021-01-16 | Discharge: 2021-01-16 | Disposition: A | Discharge status: Home / Self Care | Payer: No Typology Code available for payment source | Attending: Emergency Medicine | Admitting: Emergency Medicine

## 2021-01-16 ENCOUNTER — Emergency Department (HOSPITAL_COMMUNITY): Payer: No Typology Code available for payment source | Admitting: Anesthesiology

## 2021-01-16 ENCOUNTER — Encounter (HOSPITAL_COMMUNITY)
Admission: EM | Disposition: A | Discharge status: Home / Self Care | Payer: Self-pay | Source: Home / Self Care | Attending: Emergency Medicine

## 2021-01-16 ENCOUNTER — Encounter (HOSPITAL_COMMUNITY): Payer: Self-pay | Admitting: *Deleted

## 2021-01-16 ENCOUNTER — Other Ambulatory Visit: Payer: Self-pay

## 2021-01-16 ENCOUNTER — Emergency Department (HOSPITAL_COMMUNITY): Payer: No Typology Code available for payment source

## 2021-01-16 DIAGNOSIS — S67194A Crushing injury of right ring finger, initial encounter: Secondary | ICD-10-CM | POA: Diagnosis not present

## 2021-01-16 DIAGNOSIS — Z20822 Contact with and (suspected) exposure to covid-19: Secondary | ICD-10-CM | POA: Diagnosis not present

## 2021-01-16 DIAGNOSIS — Z23 Encounter for immunization: Secondary | ICD-10-CM | POA: Insufficient documentation

## 2021-01-16 DIAGNOSIS — T148XXA Other injury of unspecified body region, initial encounter: Secondary | ICD-10-CM | POA: Diagnosis present

## 2021-01-16 DIAGNOSIS — Y990 Civilian activity done for income or pay: Secondary | ICD-10-CM | POA: Diagnosis not present

## 2021-01-16 DIAGNOSIS — S62634B Displaced fracture of distal phalanx of right ring finger, initial encounter for open fracture: Secondary | ICD-10-CM | POA: Insufficient documentation

## 2021-01-16 DIAGNOSIS — W230XXA Caught, crushed, jammed, or pinched between moving objects, initial encounter: Secondary | ICD-10-CM | POA: Insufficient documentation

## 2021-01-16 HISTORY — PX: I & D EXTREMITY: SHX5045

## 2021-01-16 LAB — RESP PANEL BY RT-PCR (FLU A&B, COVID) ARPGX2
Influenza A by PCR: NEGATIVE
Influenza B by PCR: NEGATIVE
SARS Coronavirus 2 by RT PCR: NEGATIVE

## 2021-01-16 LAB — SURGICAL PCR SCREEN
MRSA, PCR: NEGATIVE
Staphylococcus aureus: NEGATIVE

## 2021-01-16 SURGERY — IRRIGATION AND DEBRIDEMENT EXTREMITY
Anesthesia: General | Site: Finger | Laterality: Right

## 2021-01-16 MED ORDER — MIDAZOLAM HCL 2 MG/2ML IJ SOLN
INTRAMUSCULAR | Status: DC | PRN
  Administered 2021-01-16: 2 mg via INTRAVENOUS

## 2021-01-16 MED ORDER — FENTANYL CITRATE (PF) 250 MCG/5ML IJ SOLN
INTRAMUSCULAR | Status: DC | PRN
  Administered 2021-01-16: 25 ug via INTRAVENOUS

## 2021-01-16 MED ORDER — SUGAMMADEX SODIUM 500 MG/5ML IV SOLN
INTRAVENOUS | Status: AC
  Filled 2021-01-16: qty 5

## 2021-01-16 MED ORDER — FENTANYL CITRATE (PF) 250 MCG/5ML IJ SOLN
INTRAMUSCULAR | Status: AC
  Filled 2021-01-16: qty 5

## 2021-01-16 MED ORDER — PHENYLEPHRINE 40 MCG/ML (10ML) SYRINGE FOR IV PUSH (FOR BLOOD PRESSURE SUPPORT)
PREFILLED_SYRINGE | INTRAVENOUS | Status: DC | PRN
  Administered 2021-01-16: 80 ug via INTRAVENOUS

## 2021-01-16 MED ORDER — AMISULPRIDE (ANTIEMETIC) 5 MG/2ML IV SOLN: 10.0000 mg | Freq: Once | INTRAVENOUS | Status: DC | PRN

## 2021-01-16 MED ORDER — SUCCINYLCHOLINE CHLORIDE 200 MG/10ML IV SOSY
PREFILLED_SYRINGE | INTRAVENOUS | Status: AC
  Filled 2021-01-16: qty 10

## 2021-01-16 MED ORDER — LACTATED RINGERS IV SOLN: INTRAVENOUS | Status: DC

## 2021-01-16 MED ORDER — ONDANSETRON HCL 4 MG/2ML IJ SOLN
INTRAMUSCULAR | Status: AC
  Filled 2021-01-16: qty 2

## 2021-01-16 MED ORDER — LIDOCAINE 2% (20 MG/ML) 5 ML SYRINGE
INTRAMUSCULAR | Status: DC | PRN
  Administered 2021-01-16: 20 mg via INTRAVENOUS

## 2021-01-16 MED ORDER — CEFAZOLIN SODIUM-DEXTROSE 2-3 GM-%(50ML) IV SOLR
INTRAVENOUS | Status: DC | PRN
  Administered 2021-01-16: 2 g via INTRAVENOUS

## 2021-01-16 MED ORDER — DEXAMETHASONE SODIUM PHOSPHATE 10 MG/ML IJ SOLN
INTRAMUSCULAR | Status: AC
  Filled 2021-01-16: qty 1

## 2021-01-16 MED ORDER — EPHEDRINE 5 MG/ML INJ
INTRAVENOUS | Status: AC
  Filled 2021-01-16: qty 5

## 2021-01-16 MED ORDER — CEFAZOLIN SODIUM-DEXTROSE 2-4 GM/100ML-% IV SOLN
2.0000 g | Freq: Once | INTRAVENOUS | Status: AC
  Administered 2021-01-16: 2 g via INTRAVENOUS
  Filled 2021-01-16: qty 100

## 2021-01-16 MED ORDER — PROPOFOL 10 MG/ML IV BOLUS
INTRAVENOUS | Status: AC
  Filled 2021-01-16: qty 20

## 2021-01-16 MED ORDER — FENTANYL CITRATE PF 50 MCG/ML IJ SOSY
50.0000 ug | PREFILLED_SYRINGE | Freq: Once | INTRAMUSCULAR | Status: AC
  Administered 2021-01-16: 50 ug via INTRAVENOUS
  Filled 2021-01-16: qty 1

## 2021-01-16 MED ORDER — HYDROCODONE-ACETAMINOPHEN 5-325 MG PO TABS: 1.0000 | ORAL_TABLET | ORAL | 0 refills | Status: DC | PRN

## 2021-01-16 MED ORDER — 0.9 % SODIUM CHLORIDE (POUR BTL) OPTIME
TOPICAL | Status: DC | PRN
  Administered 2021-01-16: 1000 mL

## 2021-01-16 MED ORDER — BUPIVACAINE HCL (PF) 0.25 % IJ SOLN
INTRAMUSCULAR | Status: AC
  Filled 2021-01-16: qty 30

## 2021-01-16 MED ORDER — PROMETHAZINE HCL 25 MG/ML IJ SOLN: 6.2500 mg | INTRAMUSCULAR | Status: DC | PRN

## 2021-01-16 MED ORDER — BUPIVACAINE HCL (PF) 0.25 % IJ SOLN
INTRAMUSCULAR | Status: DC | PRN
  Administered 2021-01-16: 30 mL

## 2021-01-16 MED ORDER — ROCURONIUM BROMIDE 10 MG/ML (PF) SYRINGE
PREFILLED_SYRINGE | INTRAVENOUS | Status: AC
  Filled 2021-01-16: qty 10

## 2021-01-16 MED ORDER — FENTANYL CITRATE (PF) 100 MCG/2ML IJ SOLN: 25.0000 ug | INTRAMUSCULAR | Status: DC | PRN

## 2021-01-16 MED ORDER — DEXAMETHASONE SODIUM PHOSPHATE 10 MG/ML IJ SOLN
INTRAMUSCULAR | Status: DC | PRN
  Administered 2021-01-16: 5 mg via INTRAVENOUS

## 2021-01-16 MED ORDER — STERILE WATER FOR IRRIGATION IR SOLN
Status: DC | PRN
  Administered 2021-01-16: 800 mL

## 2021-01-16 MED ORDER — CHLORHEXIDINE GLUCONATE 0.12 % MT SOLN
15.0000 mL | Freq: Once | OROMUCOSAL | Status: AC
  Administered 2021-01-16: 15 mL via OROMUCOSAL
  Filled 2021-01-16: qty 15

## 2021-01-16 MED ORDER — TETANUS-DIPHTH-ACELL PERTUSSIS 5-2.5-18.5 LF-MCG/0.5 IM SUSY
0.5000 mL | PREFILLED_SYRINGE | Freq: Once | INTRAMUSCULAR | Status: AC
  Administered 2021-01-16: 0.5 mL via INTRAMUSCULAR
  Filled 2021-01-16: qty 0.5

## 2021-01-16 MED ORDER — CEPHALEXIN 500 MG PO CAPS: 500.0000 mg | ORAL_CAPSULE | Freq: Four times a day (QID) | ORAL | 0 refills | Status: AC

## 2021-01-16 MED ORDER — LIDOCAINE 2% (20 MG/ML) 5 ML SYRINGE
INTRAMUSCULAR | Status: AC
  Filled 2021-01-16: qty 5

## 2021-01-16 MED ORDER — MIDAZOLAM HCL 2 MG/2ML IJ SOLN
INTRAMUSCULAR | Status: AC
  Filled 2021-01-16: qty 2

## 2021-01-16 MED ORDER — ARTIFICIAL TEARS OPHTHALMIC OINT
TOPICAL_OINTMENT | OPHTHALMIC | Status: AC
  Filled 2021-01-16: qty 3.5

## 2021-01-16 MED ORDER — PROPOFOL 10 MG/ML IV BOLUS
INTRAVENOUS | Status: DC | PRN
  Administered 2021-01-16: 150 mg via INTRAVENOUS

## 2021-01-16 MED ORDER — ONDANSETRON HCL 4 MG/2ML IJ SOLN
INTRAMUSCULAR | Status: DC | PRN
  Administered 2021-01-16: 4 mg via INTRAVENOUS

## 2021-01-16 MED ORDER — ORAL CARE MOUTH RINSE: 15.0000 mL | Freq: Once | OROMUCOSAL | Status: AC

## 2021-01-16 MED ORDER — PHENYLEPHRINE 40 MCG/ML (10ML) SYRINGE FOR IV PUSH (FOR BLOOD PRESSURE SUPPORT)
PREFILLED_SYRINGE | INTRAVENOUS | Status: AC
  Filled 2021-01-16: qty 10

## 2021-01-16 MED ORDER — LIDOCAINE-EPINEPHRINE (PF) 2 %-1:200000 IJ SOLN
20.0000 mL | Freq: Once | INTRAMUSCULAR | Status: AC
  Administered 2021-01-16: 20 mL
  Filled 2021-01-16: qty 20

## 2021-01-16 SURGICAL SUPPLY — 58 items
BAG COUNTER SPONGE SURGICOUNT (BAG) IMPLANT
BNDG COHESIVE 1X5 TAN STRL LF (GAUZE/BANDAGES/DRESSINGS) ×2 IMPLANT
BNDG CONFORM 2 STRL LF (GAUZE/BANDAGES/DRESSINGS) ×2 IMPLANT
BNDG ELASTIC 2X5.8 VLCR STR LF (GAUZE/BANDAGES/DRESSINGS) IMPLANT
BNDG ELASTIC 3X5.8 VLCR STR LF (GAUZE/BANDAGES/DRESSINGS) IMPLANT
BNDG ELASTIC 4X5.8 VLCR STR LF (GAUZE/BANDAGES/DRESSINGS) IMPLANT
BNDG GAUZE ELAST 4 BULKY (GAUZE/BANDAGES/DRESSINGS) IMPLANT
CAP PIN ORTHO PINK (CAP) ×2 IMPLANT
CORD BIPOLAR FORCEPS 12FT (ELECTRODE) ×2 IMPLANT
COVER SURGICAL LIGHT HANDLE (MISCELLANEOUS) ×2 IMPLANT
CUFF TOURN SGL QUICK 18X4 (TOURNIQUET CUFF) IMPLANT
CUFF TOURN SGL QUICK 24 (TOURNIQUET CUFF)
CUFF TRNQT CYL 24X4X16.5-23 (TOURNIQUET CUFF) IMPLANT
DRAPE OEC MINIVIEW 54X84 (DRAPES) ×2 IMPLANT
DRAPE SURG 17X23 STRL (DRAPES) ×2 IMPLANT
DRSG ADAPTIC 3X8 NADH LF (GAUZE/BANDAGES/DRESSINGS) IMPLANT
DRSG XEROFORM 1X8 (GAUZE/BANDAGES/DRESSINGS) ×2 IMPLANT
GAUZE 4X4 16PLY ~~LOC~~+RFID DBL (SPONGE) ×2 IMPLANT
GAUZE SPONGE 2X2 8PLY STRL LF (GAUZE/BANDAGES/DRESSINGS) IMPLANT
GAUZE SPONGE 4X4 12PLY STRL (GAUZE/BANDAGES/DRESSINGS) IMPLANT
GAUZE SPONGE 4X4 12PLY STRL LF (GAUZE/BANDAGES/DRESSINGS) ×2 IMPLANT
GLOVE SRG 8 PF TXTR STRL LF DI (GLOVE) ×1 IMPLANT
GLOVE SURG ORTHO LTX SZ8 (GLOVE) IMPLANT
GLOVE SURG PR MICRO ENCORE 7 (GLOVE) ×2 IMPLANT
GLOVE SURG PR MICRO ENCORE 7.5 (GLOVE) ×2 IMPLANT
GLOVE SURG UNDER POLY LF SZ6 (GLOVE) ×2 IMPLANT
GLOVE SURG UNDER POLY LF SZ7.5 (GLOVE) ×2 IMPLANT
GLOVE SURG UNDER POLY LF SZ8 (GLOVE) ×2
GLOVE SURG UNDER POLY LF SZ8.5 (GLOVE) IMPLANT
GOWN STRL REUS W/ TWL LRG LVL3 (GOWN DISPOSABLE) ×1 IMPLANT
GOWN STRL REUS W/ TWL XL LVL3 (GOWN DISPOSABLE) ×1 IMPLANT
GOWN STRL REUS W/TWL LRG LVL3 (GOWN DISPOSABLE) ×2
GOWN STRL REUS W/TWL XL LVL3 (GOWN DISPOSABLE) ×2
GUIDEWIRE ORTH 1.1XCAN SCR SYS (WIRE) ×1 IMPLANT
K-WIRE 1.1 (WIRE) ×2
KIT BASIN OR (CUSTOM PROCEDURE TRAY) ×2 IMPLANT
KIT TURNOVER KIT B (KITS) ×2 IMPLANT
MANIFOLD NEPTUNE II (INSTRUMENTS) ×2 IMPLANT
NEEDLE HYPO 25GX1X1/2 BEV (NEEDLE) ×2 IMPLANT
NS IRRIG 1000ML POUR BTL (IV SOLUTION) ×2 IMPLANT
PACK ORTHO EXTREMITY (CUSTOM PROCEDURE TRAY) ×2 IMPLANT
PAD ARMBOARD 7.5X6 YLW CONV (MISCELLANEOUS) ×2 IMPLANT
PAD CAST 4YDX4 CTTN HI CHSV (CAST SUPPLIES) IMPLANT
PADDING CAST COTTON 4X4 STRL (CAST SUPPLIES)
SOAP 2 % CHG 4 OZ (WOUND CARE) ×2 IMPLANT
SPECIMEN JAR SMALL (MISCELLANEOUS) IMPLANT
SPLINT FINGER 3.25 911903 (SOFTGOODS) ×2 IMPLANT
SPONGE GAUZE 2X2 STER 10/PKG (GAUZE/BANDAGES/DRESSINGS)
SUCTION FRAZIER HANDLE 10FR (MISCELLANEOUS) ×2
SUCTION TUBE FRAZIER 10FR DISP (MISCELLANEOUS) ×1 IMPLANT
SUT CHROMIC 4 0 PS 2 18 (SUTURE) ×4 IMPLANT
SUT MERSILENE 4 0 P 3 (SUTURE) IMPLANT
SUT PROLENE 4 0 PS 2 18 (SUTURE) IMPLANT
SYR CONTROL 10ML LL (SYRINGE) ×2 IMPLANT
TOWEL GREEN STERILE (TOWEL DISPOSABLE) ×2 IMPLANT
TOWEL GREEN STERILE FF (TOWEL DISPOSABLE) ×2 IMPLANT
TUBE CONNECTING 12X1/4 (SUCTIONS) ×2 IMPLANT
WATER STERILE IRR 1000ML POUR (IV SOLUTION) ×2 IMPLANT

## 2021-01-16 NOTE — Interval H&P Note (Signed)
History and Physical Interval Note:  01/16/2021 7:21 PM  Gail Bruce  has presented today for surgery, with the diagnosis of open displaced fracture of right finger.  The various methods of treatment have been discussed with the patient and family. After consideration of risks, benefits and other options for treatment, the patient has consented to  Procedure(s): IRRIGATION AND DEBRIDEMENT RING FINGER vs AMPUTATION (Right) as a surgical intervention.  The patient's history has been reviewed, patient examined, no change in status, stable for surgery.  I have reviewed the patient's chart and labs.  Questions were answered to the patient's satisfaction.    I did examine the patient in the preoperative holding area.  She is able to flex her finger but has no sensation in the fingertip.  The fingertip does look a bit bruised.  There does appear to be some cap refill in the nailbed but a bit less brisk than the adjacent fingers.  I explained to her that I would look at the neurovascular bundles but given that the laceration is at the level of the DIP flexion crease it might be difficult to perform any vascular repairs.  Additionally given the crush injury the likelihood of successful revascularization would be low particularly at that level.  I explained my plan would be to pin the bone for stabilization and then repair any other injured structures.  I would plan to leave the fingertip and allow it to declare itself over the next few days if it does end up being devascularized.  She is in agreement with this plan.   Allena Napoleon

## 2021-01-16 NOTE — Consult Note (Signed)
Reason for Consult:Right ring finger injury Referring Physician: Eber Hong Time called: 1224 Time at bedside: 1325   Gail Bruce is an 45 y.o. female.  HPI: Gail Bruce was working today and somehow got her right ring finger caught in a machine. She suffered a significant laceration and was brought to the ED. X-rays showed a distal phalanx fx and orthopedic surgery was consulted. She is RHD.  Past Medical History:  Diagnosis Date   Back pain    Chronic headache    Depression     Past Surgical History:  Procedure Laterality Date   ABDOMINAL HYSTERECTOMY     due to infection?   CESAREAN SECTION      Family History  Problem Relation Age of Onset   Other Mother        unknown   Other Father        unknown    Social History:  reports that she has never smoked. She has never used smokeless tobacco. She reports that she does not drink alcohol and does not use drugs.  Allergies: No Known Allergies  Medications: I have reviewed the patient's current medications.  No results found for this or any previous visit (from the past 48 hour(s)).  DG Hand Complete Right  Result Date: 01/16/2021 CLINICAL DATA:  Laceration to distal ring finger EXAM: RIGHT HAND - COMPLETE 3+ VIEW COMPARISON:  None. FINDINGS: There is a mildly displaced transversely oriented fracture through the ring finger distal phalanx with approximately 2 mm offset of the distal fragment. There is no definite evidence of intra-articular extension. There is surrounding soft tissue swelling. No other fracture is seen. Alignment is otherwise normal. The joint spaces are preserved. IMPRESSION: Mildly displaced fracture of the distal phalanx of the ring finger without definite evidence of intra-articular extension. Electronically Signed   By: Lesia Hausen M.D.   On: 01/16/2021 11:46    Review of Systems  HENT:  Negative for ear discharge, ear pain, hearing loss and tinnitus.   Eyes:  Negative for photophobia and pain.   Respiratory:  Negative for cough and shortness of breath.   Cardiovascular:  Negative for chest pain.  Gastrointestinal:  Negative for abdominal pain, nausea and vomiting.  Genitourinary:  Negative for dysuria, flank pain, frequency and urgency.  Musculoskeletal:  Positive for arthralgias (Right ring finger). Negative for back pain, myalgias and neck pain.  Neurological:  Negative for dizziness and headaches.  Hematological:  Does not bruise/bleed easily.  Psychiatric/Behavioral:  The patient is not nervous/anxious.   Blood pressure (!) 142/93, pulse 66, temperature 98.3 F (36.8 C), temperature source Oral, resp. rate 16, SpO2 97 %. Physical Exam Constitutional:      General: She is not in acute distress.    Appearance: She is well-developed. She is not diaphoretic.  HENT:     Head: Normocephalic and atraumatic.  Eyes:     General: No scleral icterus.       Right eye: No discharge.        Left eye: No discharge.     Conjunctiva/sclera: Conjunctivae normal.  Cardiovascular:     Rate and Rhythm: Normal rate and regular rhythm.  Pulmonary:     Effort: Pulmonary effort is normal. No respiratory distress.  Musculoskeletal:     Cervical back: Normal range of motion.     Comments: Right shoulder, elbow, wrist, digits- no skin wounds, ring finger tip with circumferential lac at DIP joint, dusky, altered sensation ulnar aspect, insensate radial aspect, no instability, no blocks  to motion  Sens  Ax/R/M/U intact  Mot   Ax/ R/ PIN/ M/ AIN/ U intact  Rad 2+  Skin:    General: Skin is warm and dry.  Neurological:     Mental Status: She is alert.  Psychiatric:        Mood and Affect: Mood normal.        Behavior: Behavior normal.    Assessment/Plan: Right ring finger injury -- Will take to OR for I&D, possible CRPP vs revision amputation by Dr. Pace. Anticipate discharge after surgery.    Kenta Laster J. Kannon Baum, PA-C Orthopedic Surgery [phone] 01/16/2021, 1:33 PM  

## 2021-01-16 NOTE — Anesthesia Preprocedure Evaluation (Addendum)
Anesthesia Evaluation  Patient identified by MRN, date of birth, ID band Patient awake    Reviewed: Allergy & Precautions, NPO status , Patient's Chart, lab work & pertinent test results  History of Anesthesia Complications Negative for: history of anesthetic complications  Airway Mallampati: II  TM Distance: >3 FB Neck ROM: Full    Dental no notable dental hx. (+) Dental Advisory Given   Pulmonary neg pulmonary ROS,    Pulmonary exam normal        Cardiovascular negative cardio ROS Normal cardiovascular exam     Neuro/Psych PSYCHIATRIC DISORDERS Depression negative neurological ROS     GI/Hepatic negative GI ROS, Neg liver ROS,   Endo/Other  negative endocrine ROS  Renal/GU negative Renal ROS     Musculoskeletal   Abdominal   Peds  Hematology negative hematology ROS (+)   Anesthesia Other Findings   Reproductive/Obstetrics                            Anesthesia Physical Anesthesia Plan  ASA: 2  Anesthesia Plan: General   Post-op Pain Management:    Induction: Intravenous  PONV Risk Score and Plan: 4 or greater and Ondansetron, Midazolam and Dexamethasone  Airway Management Planned: LMA  Additional Equipment:   Intra-op Plan:   Post-operative Plan: Extubation in OR  Informed Consent: I have reviewed the patients History and Physical, chart, labs and discussed the procedure including the risks, benefits and alternatives for the proposed anesthesia with the patient or authorized representative who has indicated his/her understanding and acceptance.     Dental advisory given and Interpreter used for interveiw  Plan Discussed with: Anesthesiologist and CRNA  Anesthesia Plan Comments: (Daughter in room for interview, interpreter also used for interview.)       Anesthesia Quick Evaluation

## 2021-01-16 NOTE — ED Provider Notes (Signed)
This patient is a 45 year old female presenting from work after having her right fourth finger caught in a machine, this caused a compression type injury which resulted in a laceration of the distal finger at the DIP area.  The finger itself appears to have a normal appearance with its general anatomy however there is a almost circumferential laceration at that area.  Bleeding is controlled, no obvious swelling, seems to have capillary refill at the tip of the finger, restricted range of motion secondary to pain, will need digital block after x-ray, may need hand consultation if there is an open fracture, pain medications ordered.  Medical screening examination/treatment/procedure(s) were conducted as a shared visit with non-physician practitioner(s) and myself.  I personally evaluated the patient during the encounter.  Clinical Impression:   Final diagnoses:  None         Eber Hong, MD 01/26/21 [phone]

## 2021-01-16 NOTE — H&P (View-Only) (Signed)
Reason for Consult:Right ring finger injury Referring Physician: Eber Hong Time called: 1224 Time at bedside: 1325   Gail Bruce is an 45 y.o. female.  HPI: Gail Bruce was working today and somehow got her right ring finger caught in a machine. She suffered a significant laceration and was brought to the ED. X-rays showed a distal phalanx fx and orthopedic surgery was consulted. She is RHD.  Past Medical History:  Diagnosis Date   Back pain    Chronic headache    Depression     Past Surgical History:  Procedure Laterality Date   ABDOMINAL HYSTERECTOMY     due to infection?   CESAREAN SECTION      Family History  Problem Relation Age of Onset   Other Mother        unknown   Other Father        unknown    Social History:  reports that she has never smoked. She has never used smokeless tobacco. She reports that she does not drink alcohol and does not use drugs.  Allergies: No Known Allergies  Medications: I have reviewed the patient's current medications.  No results found for this or any previous visit (from the past 48 hour(s)).  DG Hand Complete Right  Result Date: 01/16/2021 CLINICAL DATA:  Laceration to distal ring finger EXAM: RIGHT HAND - COMPLETE 3+ VIEW COMPARISON:  None. FINDINGS: There is a mildly displaced transversely oriented fracture through the ring finger distal phalanx with approximately 2 mm offset of the distal fragment. There is no definite evidence of intra-articular extension. There is surrounding soft tissue swelling. No other fracture is seen. Alignment is otherwise normal. The joint spaces are preserved. IMPRESSION: Mildly displaced fracture of the distal phalanx of the ring finger without definite evidence of intra-articular extension. Electronically Signed   By: Lesia Hausen M.D.   On: 01/16/2021 11:46    Review of Systems  HENT:  Negative for ear discharge, ear pain, hearing loss and tinnitus.   Eyes:  Negative for photophobia and pain.   Respiratory:  Negative for cough and shortness of breath.   Cardiovascular:  Negative for chest pain.  Gastrointestinal:  Negative for abdominal pain, nausea and vomiting.  Genitourinary:  Negative for dysuria, flank pain, frequency and urgency.  Musculoskeletal:  Positive for arthralgias (Right ring finger). Negative for back pain, myalgias and neck pain.  Neurological:  Negative for dizziness and headaches.  Hematological:  Does not bruise/bleed easily.  Psychiatric/Behavioral:  The patient is not nervous/anxious.   Blood pressure (!) 142/93, pulse 66, temperature 98.3 F (36.8 C), temperature source Oral, resp. rate 16, SpO2 97 %. Physical Exam Constitutional:      General: She is not in acute distress.    Appearance: She is well-developed. She is not diaphoretic.  HENT:     Head: Normocephalic and atraumatic.  Eyes:     General: No scleral icterus.       Right eye: No discharge.        Left eye: No discharge.     Conjunctiva/sclera: Conjunctivae normal.  Cardiovascular:     Rate and Rhythm: Normal rate and regular rhythm.  Pulmonary:     Effort: Pulmonary effort is normal. No respiratory distress.  Musculoskeletal:     Cervical back: Normal range of motion.     Comments: Right shoulder, elbow, wrist, digits- no skin wounds, ring finger tip with circumferential lac at DIP joint, dusky, altered sensation ulnar aspect, insensate radial aspect, no instability, no blocks  to motion  Sens  Ax/R/M/U intact  Mot   Ax/ R/ PIN/ M/ AIN/ U intact  Rad 2+  Skin:    General: Skin is warm and dry.  Neurological:     Mental Status: She is alert.  Psychiatric:        Mood and Affect: Mood normal.        Behavior: Behavior normal.    Assessment/Plan: Right ring finger injury -- Will take to OR for I&D, possible CRPP vs revision amputation by Dr. Arita Miss. Anticipate discharge after surgery.    Freeman Caldron, PA-C Orthopedic Surgery [phone] 01/16/2021, 1:33 PM

## 2021-01-16 NOTE — Op Note (Signed)
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  Open reduction internal fixation right ring finger distal phalanx 2.  Complex closure right ring finger totaling 3 cm in length  SURGEON: Ancil Linsey, MD  ASSISTANT: None  ANESTHESIA:  General.   COMPLICATIONS: None.   INDICATIONS FOR PROCEDURE:  The patient, Gail Bruce is a 45 y.o. female born on August 08, 1975, is here for treatment of right ring finger injury. MRN: [id number]  CONSENT:  Informed consent was obtained directly from the patient. Risks, benefits and alternatives were fully discussed. Specific risks including but not limited to bleeding, infection, hematoma, seroma, scarring, pain, contracture, asymmetry, wound healing problems, and need for further surgery were all discussed. The patient did have an ample opportunity to have questions answered to satisfaction.   DESCRIPTION OF PROCEDURE:  The patient was taken to the operating room. SCDs were placed and antibiotics were given.  General anesthesia was administered.  The patient's operative site was prepped and draped in a sterile fashion. A time out was performed and all information was confirmed to be correct.  Started by examining the wound.  This was a near complete amputation just proximal to the eponychial fold.  This was just distal to the DIP joint.  All the dorsal structures which was mostly just skin was totally separated in addition to the bone being fractured and displaced.  Flexor tendon was intact and it appeared that both neurovascular bundles were also in continuity.  The volar extent of the laceration was right at the DIP flexion crease.  Vascularity of the fingertip appeared to wax and wane throughout the case.  There were times when it was pink and appear perfused and other times when it was pale.  I  interpreted this as spasm of intact blood vessels.  I started by irrigating the wound copiously with saline.  I then look for any small areas of devitalized skin and these were carefully debrided with pickups and scissors.  I then focused on bony fixation..  A 4 5 K wire was placed proximal to distal through the distal fracture segment.  Reduction was held and maintained while the K wire was passed back across the DIP joint into the middle phalanx.  This gave an anatomic reduction of the fracture.  Skin closure was then done with interrupted 4-0 chromic sutures.  A splint was then applied.  The patient tolerated the procedure well.  There were no complications. The patient was allowed to wake from anesthesia, extubated and taken to the recovery room in satisfactory condition.

## 2021-01-16 NOTE — ED Triage Notes (Signed)
Pt with R ring finger lac with saw just PTA. Last tetanus shot within ten years.

## 2021-01-16 NOTE — Anesthesia Postprocedure Evaluation (Signed)
Anesthesia Post Note  Patient: Gail Bruce  Procedure(s) Performed: 1.  Open reduction internal fixation right ring finger distal phalanx 2.  Complex closure right ring finger totaling 3 cm in length (Right: Finger)     Patient location during evaluation: PACU Anesthesia Type: General Level of consciousness: awake and alert, patient cooperative and oriented Pain management: pain level controlled Vital Signs Assessment: post-procedure vital signs reviewed and stable Respiratory status: spontaneous breathing, nonlabored ventilation and respiratory function stable Cardiovascular status: blood pressure returned to baseline and stable Postop Assessment: no apparent nausea or vomiting and able to ambulate Anesthetic complications: no   No notable events documented.  Last Vitals:  Vitals:   01/16/21 2109 01/16/21 2112  BP: (!) 149/92   Pulse: 67 66  Resp: 16 14  Temp:  36.6 C  SpO2: 97% 98%    Last Pain:  Vitals:   01/16/21 2112  TempSrc:   PainSc: 0-No pain                 Terriann Difonzo,E. Quadre [place]

## 2021-01-16 NOTE — Discharge Instructions (Signed)
No heavy activities. Elevate arm to reduce swelling.  Diet: Regular  Wound Care: Keep dressing clean & dry.  Do not get splint wet.  You can shower but make sure to place a bag over the splint with a good seal to keep it dry.  Do not remove dressing.  Call doctor if any unusual problems occur such as pain, excessive bleeding, unrelieved nausea/vomiting, fever &/or chills.  Follow-up appointment: Next week at surgeon's office.

## 2021-01-16 NOTE — ED Provider Notes (Signed)
[facility] EMERGENCY DEPARTMENT Provider Note   CSN: [id number] Arrival date & time: 01/16/21  1019     History No chief complaint on file.   Gail Bruce is a 45 y.o. female.  Patient presents after she was seen she was at work today and she crushed her hand in a diaper machine.  She has a circumferential left ring finger distal laceration.  She has numbness in the distal fingertip.  She has pain in her left hand and left finger.  HPI     Past Medical History:  Diagnosis Date   Back pain    Chronic headache    Depression     There are no problems to display for this patient.   Past Surgical History:  Procedure Laterality Date   ABDOMINAL HYSTERECTOMY     due to infection?   CESAREAN SECTION       OB History   No obstetric history on file.     Family History  Problem Relation Age of Onset   Other Mother        unknown   Other Father        unknown    Social History   Tobacco Use   Smoking status: Never   Smokeless tobacco: Never  Substance Use Topics   Alcohol use: No   Drug use: No    Home Medications Prior to Admission medications   Medication Sig Start Date End Date Taking? Authorizing Provider  Acetaminophen (TYLENOL PO) Take by mouth as needed.    [provider]    Allergies    Patient has no known allergies.  Review of Systems   Review of Systems  Musculoskeletal:  Positive for arthralgias.  Skin:  Positive for wound.  Neurological:  Positive for numbness.   Physical Exam Updated Vital Signs BP (!) 142/93   Pulse 66   Temp 98.3 F (36.8 C) (Oral)   Resp 16   SpO2 97%   Physical Exam Vitals and nursing note reviewed.  Constitutional:      General: She is not in acute distress.    Appearance: Normal appearance. She is not ill-appearing, toxic-appearing or diaphoretic.  HENT:     Head: Normocephalic and atraumatic.  Eyes:     General: No scleral icterus.       Right eye: No discharge.         Left eye: No discharge.     Conjunctiva/sclera: Conjunctivae normal.  Pulmonary:     Effort: Pulmonary effort is normal. No respiratory distress.  Musculoskeletal:     Right hand: Tenderness present. No swelling or deformity. Decreased range of motion. Normal pulse.  Skin:    Findings: Laceration present.     Comments: Almost complete Circumferential laceration to distal finger tip. No diffuse bleeding. Appears clean. Numbness distal to site. Cap refill normal.   Neurological:     Mental Status: She is alert.  Psychiatric:        Mood and Affect: Mood normal.        Behavior: Behavior normal.    ED Results / Procedures / Treatments   Labs (all labs ordered are listed, but only abnormal results are displayed) Labs Reviewed - No data to display  EKG None  Radiology DG Hand Complete Right  Result Date: 01/16/2021 CLINICAL DATA:  Laceration to distal ring finger EXAM: RIGHT HAND - COMPLETE 3+ VIEW COMPARISON:  None. FINDINGS: There is a mildly displaced transversely oriented fracture through the ring finger  distal phalanx with approximately 2 mm offset of the distal fragment. There is no definite evidence of intra-articular extension. There is surrounding soft tissue swelling. No other fracture is seen. Alignment is otherwise normal. The joint spaces are preserved. IMPRESSION: Mildly displaced fracture of the distal phalanx of the ring finger without definite evidence of intra-articular extension. Electronically Signed   By: Lesia Hausen M.D.   On: 01/16/2021 11:46    Procedures Procedures   Medications Ordered in ED Medications  ceFAZolin (ANCEF) IVPB 2g/100 mL premix (has no administration in time range)  Tdap (BOOSTRIX) injection 0.5 mL (0.5 mLs Intramuscular Given 01/16/21 1057)  fentaNYL (SUBLIMAZE) injection 50 mcg (50 mcg Intravenous Given 01/16/21 1057)  lidocaine-EPINEPHrine (XYLOCAINE W/EPI) 2 %-1:200000 (PF) injection 20 mL (20 mLs Infiltration Given by Other 01/16/21 1243)     ED Course  I have reviewed the triage vital signs and the nursing notes.  Pertinent labs & imaging results that were available during my care of the patient were reviewed by me and considered in my medical decision making (see chart for details).  Clinical Course as of 01/16/21 1353  Wed Jan 16, 2021  1155 Consult to hand [GL]    Clinical Course User Index [GL] Neythan Kozlov, Finis Bud, PA-C   MDM Rules/Calculators/A&P                         Patient with almost complete circumferential laceration to right distal ring finger.  Appears like a clean cut.  Cannot tell without irrigation if bone is present.  Patient complaining of pain to right hand as well as the ring finger.  Will obtain x-ray of that hand.  50 mcg of fentanyl given for pain.  Tdap updated.  XR read: Mildly displaced fracture of the distal phalanx of the ring finger  without definite evidence of intra-articular extension.   This is an open fracture.  Consulted hand surgery at 1155  1331 orthopedic hand saw patient.  They are going to take patient for surgery today, but they do not think they will save the finger.   Final Clinical Impression(s) / ED Diagnoses Final diagnoses:  Crush injury    Rx / DC Orders ED Discharge Orders     None        Claudie Leach, PA-C 01/16/21 1354    Eber Hong, MD 01/26/21 [phone]

## 2021-01-16 NOTE — Brief Op Note (Signed)
01/16/2021  8:29 PM  PATIENT:  Gail Bruce  45 y.o. female  PRE-OPERATIVE DIAGNOSIS:  open displaced fracture of right finger  POST-OPERATIVE DIAGNOSIS:  * No post-op diagnosis entered *  PROCEDURE:  Procedure(s): IRRIGATION AND DEBRIDEMENT RING FINGER vs AMPUTATION (Right)  SURGEON:  Surgeon(s) and Role:    * Aletha Allebach, Wendy Poet, MD - Primary  PHYSICIAN ASSISTANT: None  ASSISTANTS: none   ANESTHESIA:   none  EBL:  10   BLOOD ADMINISTERED:none  DRAINS: none   LOCAL MEDICATIONS USED:  MARCAINE     SPECIMEN:  No Specimen  DISPOSITION OF SPECIMEN:  PATHOLOGY  COUNTS:  YES  TOURNIQUET:  * Missing tourniquet times found for documented tourniquets in log: [id number] *  DICTATION: .Dragon Dictation  PLAN OF CARE: Discharge to home after PACU  PATIENT DISPOSITION:  PACU - hemodynamically stable.   Delay start of Pharmacological VTE agent (>24hrs) due to surgical blood loss or risk of bleeding: not applicable

## 2021-01-16 NOTE — Anesthesia Procedure Notes (Signed)
Procedure Name: LMA Insertion Date/Time: 01/16/2021 7:39 PM Performed by: Aundria Rud, CRNA Pre-anesthesia Checklist: Patient identified, Emergency Drugs available, Suction available and Patient being monitored Patient Re-evaluated:Patient Re-evaluated prior to induction Oxygen Delivery Method: Circle System Utilized Preoxygenation: Pre-oxygenation with 100% oxygen Induction Type: IV induction Ventilation: Mask ventilation without difficulty LMA: LMA inserted LMA Size: 3.0 Number of attempts: 1 Airway Equipment and Method: Bite block Placement Confirmation: positive ETCO2 Tube secured with: Tape Dental Injury: Teeth and Oropharynx as per pre-operative assessment

## 2021-01-16 NOTE — Transfer of Care (Signed)
Immediate Anesthesia Transfer of Care Note  Patient: Gail Bruce  Procedure(s) Performed: IRRIGATION AND DEBRIDEMENT RING FINGER vs AMPUTATION (Right)  Patient Location: PACU  Anesthesia Type:General  Level of Consciousness: drowsy, patient cooperative and responds to stimulation  Airway & Oxygen Therapy: Patient Spontanous Breathing  Post-op Assessment: Report given to RN, Post -op Vital signs reviewed and stable and Patient moving all extremities X 4  Post vital signs: Reviewed and stable  Last Vitals:  Vitals Value Taken Time  BP 121/95 01/16/21 2039  Temp 37 C 01/16/21 2039  Pulse 74 01/16/21 2044  Resp 16 01/16/21 2044  SpO2 97 % 01/16/21 2044  Vitals shown include unvalidated device data.  Last Pain:  Vitals:   01/16/21 1644  TempSrc:   PainSc: 10-Worst pain ever         Complications: No notable events documented.

## 2021-01-17 ENCOUNTER — Encounter (HOSPITAL_COMMUNITY): Payer: Self-pay | Admitting: Plastic Surgery

## 2021-01-31 ENCOUNTER — Encounter: Payer: Self-pay | Admitting: Plastic Surgery

## 2021-01-31 ENCOUNTER — Ambulatory Visit (INDEPENDENT_AMBULATORY_CARE_PROVIDER_SITE_OTHER): Payer: Worker's Compensation | Admitting: Plastic Surgery

## 2021-01-31 ENCOUNTER — Other Ambulatory Visit: Payer: Self-pay

## 2021-01-31 VITALS — Ht <= 58 in | Wt 121.6 lb

## 2021-01-31 DIAGNOSIS — S67194A Crushing injury of right ring finger, initial encounter: Secondary | ICD-10-CM

## 2021-01-31 NOTE — Progress Notes (Signed)
Patient presents postop after an injury to her ring finger.  This was an avulsion injury to the fingertip distal to the level of the DIP joint.  There appeared to be intact vascular bundles at surgery.  I did repair of the dorsal skin and extensor mechanism and pin the bone.  Unfortunately today it looks like her fingertip has necrosis.  There is no signs of infection.  The pin is in place.  I explained she will need a revision amputation and we will plan to do that shortly.  I demonstrated the level of the amputation explained the rationale through an interpreter and she is fully understanding.  We will be in touch with her about a surgery date.

## 2021-12-18 ENCOUNTER — Ambulatory Visit (HOSPITAL_COMMUNITY)
Admission: EM | Admit: 2021-12-18 | Discharge: 2021-12-18 | Disposition: A | Discharge status: Home / Self Care | Payer: BC Managed Care – PPO | Attending: Physician Assistant | Admitting: Physician Assistant

## 2021-12-18 ENCOUNTER — Encounter (HOSPITAL_COMMUNITY): Payer: Self-pay

## 2021-12-18 DIAGNOSIS — K0889 Other specified disorders of teeth and supporting structures: Secondary | ICD-10-CM | POA: Diagnosis not present

## 2021-12-18 DIAGNOSIS — K047 Periapical abscess without sinus: Secondary | ICD-10-CM | POA: Diagnosis not present

## 2021-12-18 MED ORDER — IBUPROFEN 800 MG PO TABS: 800.0000 mg | ORAL_TABLET | Freq: Three times a day (TID) | ORAL | 0 refills | Status: AC

## 2021-12-18 MED ORDER — AMOXICILLIN-POT CLAVULANATE 875-125 MG PO TABS: 1.0000 | ORAL_TABLET | Freq: Two times a day (BID) | ORAL | 0 refills | Status: DC

## 2021-12-18 MED ORDER — CEFTRIAXONE SODIUM 1 G IJ SOLR
1.0000 g | Freq: Once | INTRAMUSCULAR | Status: AC
  Administered 2021-12-18: 1 g via INTRAMUSCULAR

## 2021-12-18 MED ORDER — KETOROLAC TROMETHAMINE 30 MG/ML IJ SOLN
30.0000 mg | Freq: Once | INTRAMUSCULAR | Status: AC
  Administered 2021-12-18: 30 mg via INTRAMUSCULAR

## 2021-12-18 MED ORDER — KETOROLAC TROMETHAMINE 30 MG/ML IJ SOLN
INTRAMUSCULAR | Status: AC
  Filled 2021-12-18: qty 1

## 2021-12-18 MED ORDER — CEFTRIAXONE SODIUM 1 G IJ SOLR
INTRAMUSCULAR | Status: AC
  Filled 2021-12-18: qty 10

## 2021-12-18 MED ORDER — ACETAMINOPHEN 325 MG PO TABS
975.0000 mg | ORAL_TABLET | Freq: Once | ORAL | Status: AC
Start: 2021-12-18 — End: 2021-12-18
  Administered 2021-12-18: 975 mg via ORAL

## 2021-12-18 MED ORDER — LIDOCAINE HCL (PF) 2 % IJ SOLN
INTRAMUSCULAR | Status: AC
  Filled 2021-12-18: qty 5

## 2021-12-18 MED ORDER — LIDOCAINE HCL (PF) 1 % IJ SOLN
INTRAMUSCULAR | Status: AC
  Filled 2021-12-18: qty 4

## 2021-12-18 MED ORDER — ACETAMINOPHEN 325 MG PO TABS
ORAL_TABLET | ORAL | Status: AC
  Filled 2021-12-18: qty 3

## 2021-12-18 NOTE — ED Provider Notes (Signed)
[facility]    CSN: [id number] Arrival date & time: 12/18/21  4854      History   Chief Complaint Chief Complaint  Patient presents with   Dental Pain    HPI Gail Bruce is a 46 y.o. female.   Patient presents today companied by her daughter who help provide the majority of history as well as translation.  Reports a 1 day history of severe dental pain and swelling involving her right lower jaw.  She reports that pain is rated 10 on a 0-10 pain scale, described as throbbing, worse with mastication, no alleviating factors benefit.  She has tried over-the-counter Tylenol with minimal improvement of symptoms.  Reports subjective fever.  Denies any recent dental procedure.  Denies any known dental problem in this area.  She has been able to eat and drink though she is subsisting on primarily soft foods due to pain with mastication.  She denies any shortness of breath, throat swelling, muffled voice.  She denies any recent antibiotic use.  She is confident that she is not pregnant.    Past Medical History:  Diagnosis Date   Back pain    Chronic headache    Depression     There are no problems to display for this patient.   Past Surgical History:  Procedure Laterality Date   ABDOMINAL HYSTERECTOMY     due to infection?   CESAREAN SECTION     I & D EXTREMITY Right 01/16/2021   Procedure: 1.  Open reduction internal fixation right ring finger distal phalanx 2.  Complex closure right ring finger totaling 3 cm in length;  Surgeon: Allena Napoleon, MD;  Location: [facility];  Service: Plastics;  Laterality: Right;    OB History   No obstetric history on file.      Home Medications    Prior to Admission medications   Medication Sig Start Date End Date Taking? Authorizing Provider  amoxicillin-clavulanate (AUGMENTIN) 875-125 MG tablet Take 1 tablet by mouth every 12 (twelve) hours. 12/18/21  Yes Shelva Hetzer K, PA-C  ibuprofen (ADVIL) 800 MG tablet Take 1 tablet (800 mg  total) by mouth 3 (three) times daily. 12/18/21  Yes Zema Lizardo K, PA-C  Acetaminophen (TYLENOL PO) Take by mouth as needed.    [provider]  HYDROcodone-acetaminophen (NORCO) 5-325 MG tablet Take 1 tablet by mouth every 4 (four) hours as needed for moderate pain. 01/16/21   Allena Napoleon, MD    Family History Family History  Problem Relation Age of Onset   Other Mother        unknown   Other Father        unknown    Social History Social History   Tobacco Use   Smoking status: Never   Smokeless tobacco: Never  Substance Use Topics   Alcohol use: No   Drug use: No     Allergies   Patient has no known allergies.   Review of Systems Review of Systems  Constitutional:  Positive for activity change and fever. Negative for appetite change and fatigue.  HENT:  Positive for dental problem and facial swelling. Negative for congestion, sneezing and sore throat.   Respiratory:  Negative for cough and shortness of breath.   Cardiovascular:  Negative for chest pain.  Gastrointestinal:  Negative for abdominal pain, diarrhea, nausea and vomiting.  Neurological:  Negative for dizziness, light-headedness and headaches.     Physical Exam Triage Vital Signs ED Triage Vitals [12/18/21 0823]  Enc Vitals Group     BP (!) 142/96     Pulse Rate (!) 120     Resp 16     Temp (!) 97 F (36.1 C)     Temp Source Oral     SpO2 100 %     Weight      Height      Head Circumference      Peak Flow      Pain Score      Pain Loc      Pain Edu?      Excl. in GC?    No data found.  Updated Vital Signs BP 116/84 (BP Location: Left Arm)   Pulse (!) 103   Temp (!) 97 F (36.1 C) (Oral)   Resp 16   SpO2 97%   Visual Acuity Right Eye Distance:   Left Eye Distance:   Bilateral Distance:    Right Eye Near:   Left Eye Near:    Bilateral Near:     Physical Exam Vitals reviewed.  Constitutional:      General: She is awake. She is not in acute distress.    Appearance:  Normal appearance. She is well-developed. She is not ill-appearing.     Comments: Very pleasant female appears stated age in no acute distress sitting comfortably on exam room table  HENT:     Head: Normocephalic and atraumatic.     Jaw: Swelling present.      Right Ear: External ear normal.     Left Ear: External ear normal.     Nose:     Right Sinus: No maxillary sinus tenderness or frontal sinus tenderness.     Left Sinus: No maxillary sinus tenderness or frontal sinus tenderness.     Mouth/Throat:     Dentition: Abnormal dentition. Gingival swelling and dental abscesses present.     Pharynx: Uvula midline. No oropharyngeal exudate or posterior oropharyngeal erythema.      Comments: No evidence of Ludwig angina Cardiovascular:     Rate and Rhythm: Normal rate and regular rhythm.     Heart sounds: Normal heart sounds, S1 normal and S2 normal. No murmur heard. Pulmonary:     Effort: Pulmonary effort is normal.     Breath sounds: Normal breath sounds. No wheezing, rhonchi or rales.     Comments: Clear to auscultation bilaterally. Lymphadenopathy:     Head:     Right side of head: No submental, submandibular or tonsillar adenopathy.     Left side of head: No submental, submandibular or tonsillar adenopathy.     Cervical: No cervical adenopathy.  Psychiatric:        Behavior: Behavior is cooperative.      UC Treatments / Results  Labs (all labs ordered are listed, but only abnormal results are displayed) Labs Reviewed - No data to display  EKG   Radiology No results found.  Procedures Procedures (including critical care time)  Medications Ordered in UC Medications  cefTRIAXone (ROCEPHIN) injection 1 g (1 g Intramuscular Given 12/18/21 0919)  acetaminophen (TYLENOL) tablet 975 mg (975 mg Oral Given 12/18/21 0903)  ketorolac (TORADOL) 30 MG/ML injection 30 mg (30 mg Intramuscular Given 12/18/21 0920)    Initial Impression / Assessment and Plan / UC Course  I have  reviewed the triage vital signs and the nursing notes.  Pertinent labs & imaging results that were available during my care of the patient were reviewed by me and considered in my medical decision  making (see chart for details).     Obvious dental infection was noted on exam.  Given severity of swelling will treat with 1 g of Rocephin in clinic.  She was noted to be tachycardic on exam but attributes this to pain as it did improve significantly with pain management in clinic as patient was given 30 mg of Toradol and 1 g of Tylenol.  Discussed that she should not take NSAIDs for 12 hours after Toradol injection.  She was prescribed ibuprofen 800 mg but instructed to begin this medication tomorrow (12/19/2021) and use Tylenol for the rest of today.  Discussed that she should not take additional NSAIDs with this medication as it can cause stomach bleeding.  She was started on Augmentin twice daily for 10 days.  Discussed that ultimately she will need to follow-up with a dentist and was given contact information for local provider with instruction to call to schedule an appointment.  Discussed that if she has any worsening symptoms including increased swelling, difficulty swallowing, muffled voice, fever, nausea, vomiting she needs to go to the emergency room.  Strict return precautions given.  Work excuse note provided.  Final Clinical Impressions(s) / UC Diagnoses   Final diagnoses:  Dental infection  Pain, dental     Discharge Instructions      I believe that you have a dental infection.  We given an antibiotic injection today.  Please start Augmentin twice daily.  Take this with food as it will upset your stomach.  I have prescribed ibuprofen 800 mg to use up to 3 times a day for pain.  You should not take this for 12 hours since we gave an injection of Toradol today.  I recommend starting this tomorrow and using only Tylenol for the rest of today.  Gargle with warm salt water.  You need to  follow-up with a dentist.  Please call to schedule an appointment.  If you have any increased welling, difficulty swallowing, fever, nausea, vomiting you need to go to the emergency room immediately.     ED Prescriptions     Medication Sig Dispense Auth. Provider   amoxicillin-clavulanate (AUGMENTIN) 875-125 MG tablet Take 1 tablet by mouth every 12 (twelve) hours. 20 tablet Jenevieve Kirschbaum K, PA-C   ibuprofen (ADVIL) 800 MG tablet Take 1 tablet (800 mg total) by mouth 3 (three) times daily. 21 tablet Tashima Scarpulla, Derry Skill, PA-C      PDMP not reviewed this encounter.   Terrilee Croak, PA-C 12/18/21 1021

## 2021-12-18 NOTE — ED Triage Notes (Signed)
Pt reports dental pain x 1 day.  

## 2021-12-18 NOTE — Discharge Instructions (Signed)
I believe that you have a dental infection.  We given an antibiotic injection today.  Please start Augmentin twice daily.  Take this with food as it will upset your stomach.  I have prescribed ibuprofen 800 mg to use up to 3 times a day for pain.  You should not take this for 12 hours since we gave an injection of Toradol today.  I recommend starting this tomorrow and using only Tylenol for the rest of today.  Gargle with warm salt water.  You need to follow-up with a dentist.  Please call to schedule an appointment.  If you have any increased welling, difficulty swallowing, fever, nausea, vomiting you need to go to the emergency room immediately.

## 2022-06-14 IMAGING — DX DG HAND COMPLETE 3+V*R*
1 series · 3 of 3 positions shown · non-contrast
Comparison: None.

CLINICAL DATA: Laceration to distal ring finger

EXAM:
RIGHT HAND - COMPLETE 3+ VIEW

[Series 1: hand · 0.14mm/px · 3 of 3 slices shown]
[im 1/3]
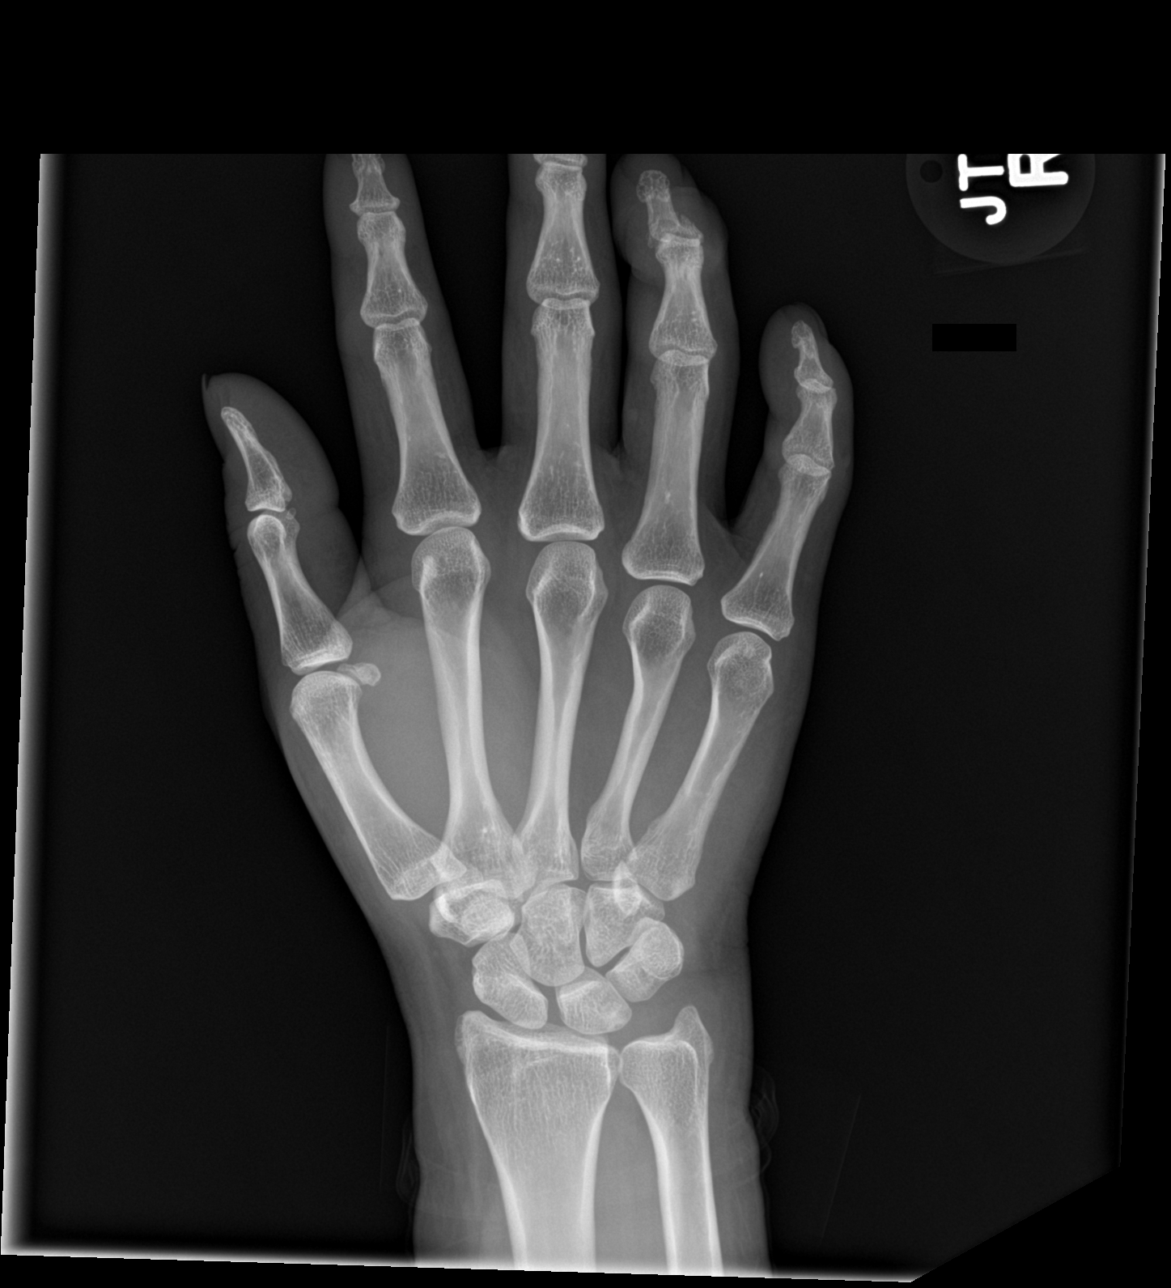
[im 2/3]
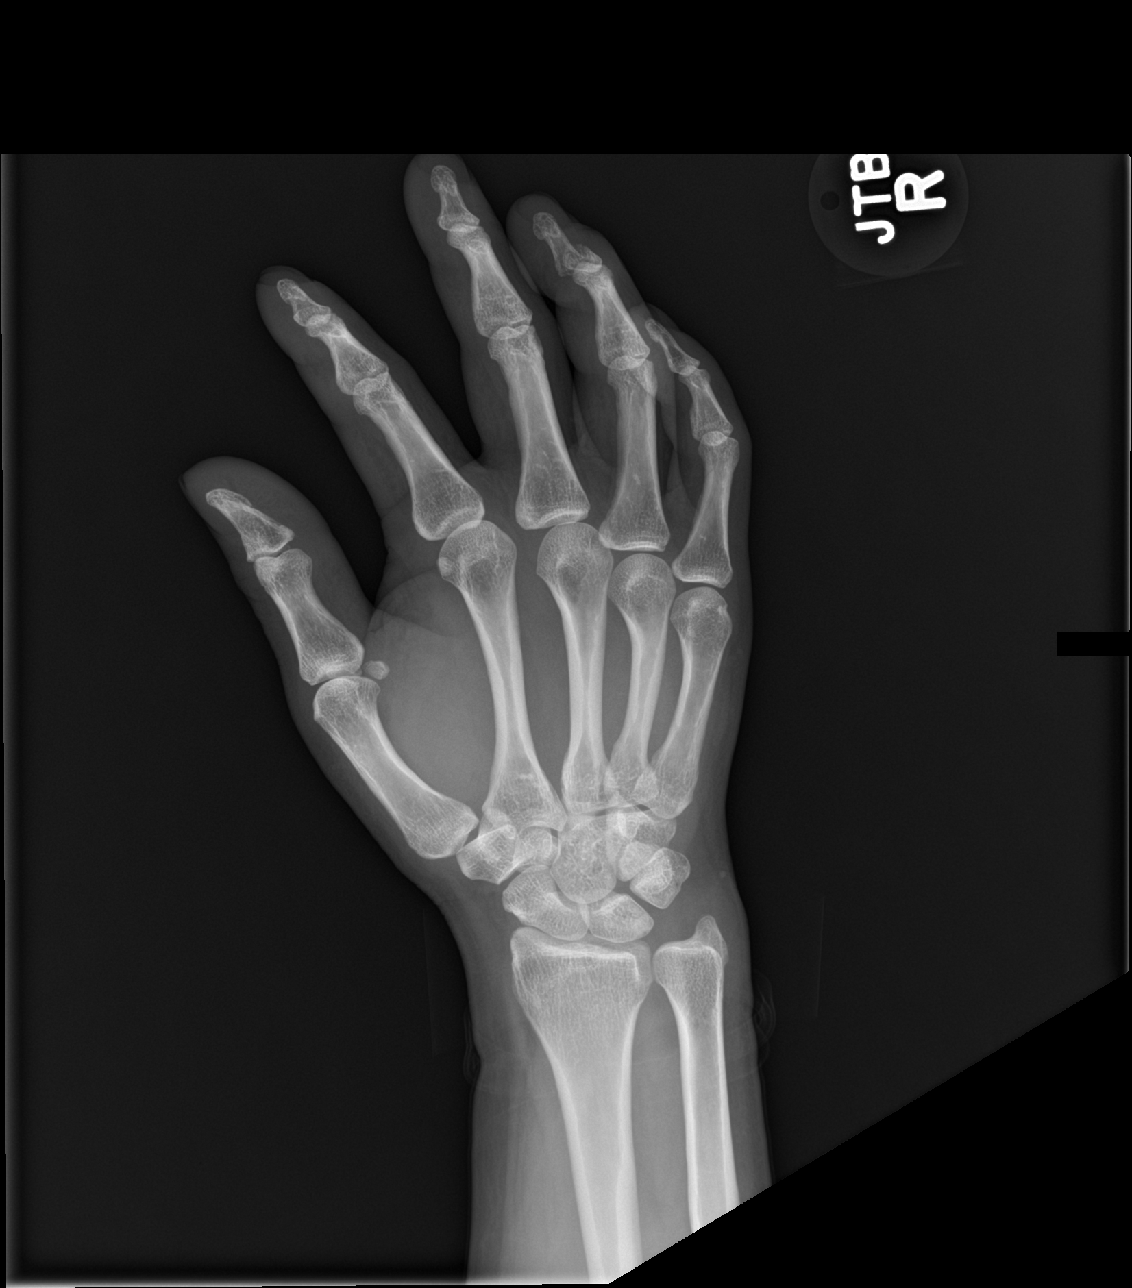
[im 3/3]
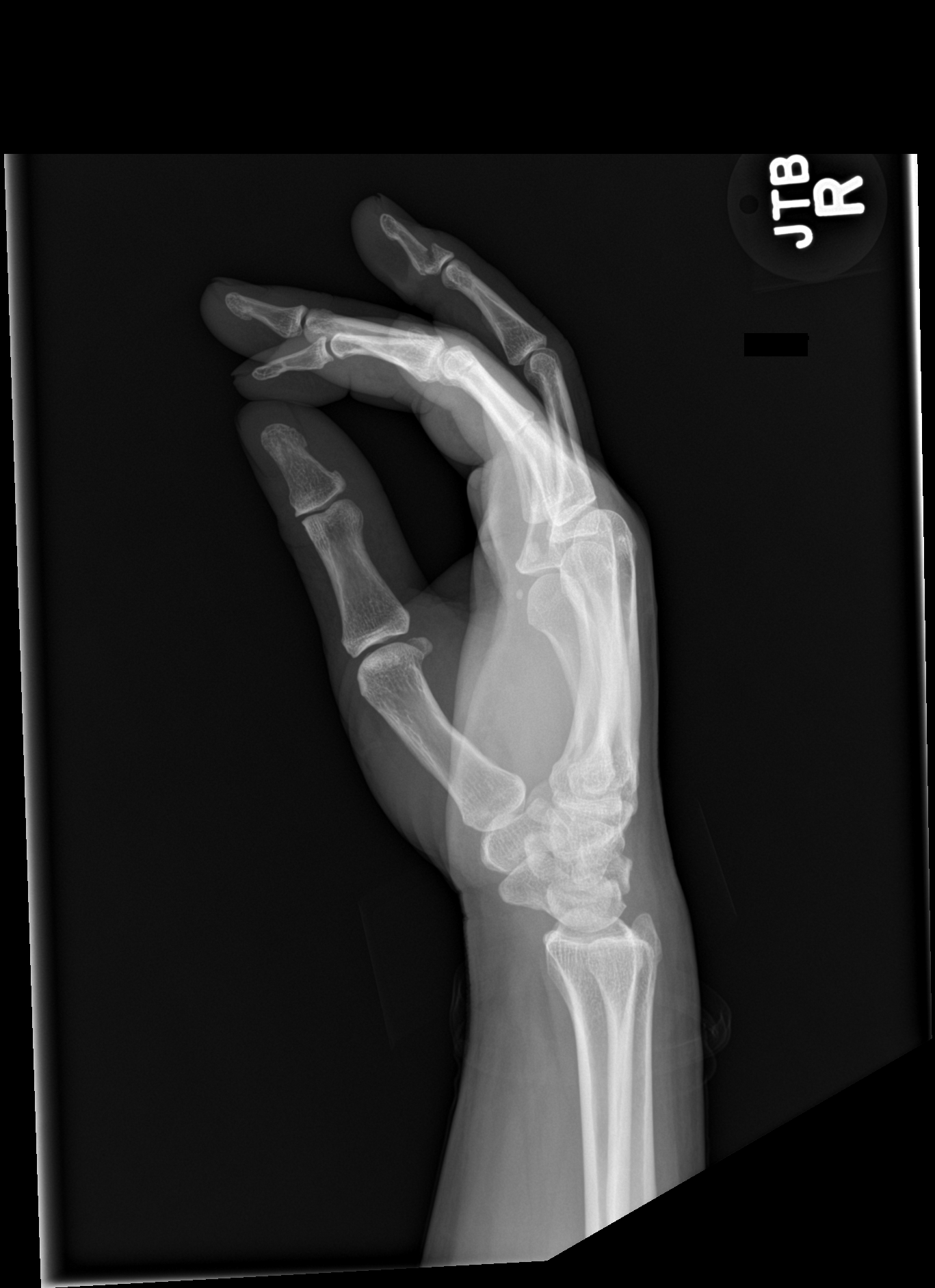

[3 of 3 positions shown; findings below may reference images not displayed]

FINDINGS: There is a mildly displaced transversely oriented fracture through
the ring finger distal phalanx with approximately 2 mm offset of the
distal fragment. There is no definite evidence of intra-articular
extension. There is surrounding soft tissue swelling.

No other fracture is seen. Alignment is otherwise normal. The joint
spaces are preserved.
IMPRESSION: Mildly displaced fracture of the distal phalanx of the ring finger
without definite evidence of intra-articular extension.

## 2023-01-12 ENCOUNTER — Ambulatory Visit (HOSPITAL_COMMUNITY)
Admission: EM | Admit: 2023-01-12 | Discharge: 2023-01-12 | Disposition: A | Discharge status: Home / Self Care | Payer: BC Managed Care – PPO | Attending: Family Medicine | Admitting: Family Medicine

## 2023-01-12 ENCOUNTER — Encounter (HOSPITAL_COMMUNITY): Payer: Self-pay

## 2023-01-12 DIAGNOSIS — M545 Low back pain, unspecified: Secondary | ICD-10-CM

## 2023-01-12 LAB — POCT URINALYSIS DIP (MANUAL ENTRY)
Bilirubin, UA: NEGATIVE
Blood, UA: NEGATIVE
Glucose, UA: NEGATIVE mg/dL
Ketones, POC UA: NEGATIVE mg/dL
Leukocytes, UA: NEGATIVE
Nitrite, UA: NEGATIVE
Protein Ur, POC: NEGATIVE mg/dL
Spec Grav, UA: 1.02 (ref 1.010–1.025)
Urobilinogen, UA: 0.2 E.U./dL
pH, UA: 7 (ref 5.0–8.0)

## 2023-01-12 MED ORDER — CYCLOBENZAPRINE HCL 5 MG PO TABS: ORAL_TABLET | ORAL | 0 refills | Status: AC

## 2023-01-12 MED ORDER — PREDNISONE 20 MG PO TABS: 40.0000 mg | ORAL_TABLET | Freq: Every day | ORAL | 0 refills | Status: AC

## 2023-01-12 MED ORDER — KETOROLAC TROMETHAMINE 60 MG/2ML IM SOLN
60.0000 mg | Freq: Once | INTRAMUSCULAR | Status: AC
  Administered 2023-01-12: 60 mg via INTRAMUSCULAR

## 2023-01-12 MED ORDER — KETOROLAC TROMETHAMINE 60 MG/2ML IM SOLN
INTRAMUSCULAR | Status: AC
  Filled 2023-01-12: qty 2

## 2023-01-12 NOTE — ED Triage Notes (Signed)
Pt presents to UC w/ c/o left lower back pain x1 week. Pt reports lifting heavy objects at work. Pt has tried tylenol and aleve without relief. Pt is limping to triage room.

## 2023-01-12 NOTE — Discharge Instructions (Signed)
Meds ordered this encounter  Medications   predniSONE (DELTASONE) 20 MG tablet    Sig: Take 2 tablets (40 mg total) by mouth daily.    Dispense:  10 tablet    Refill:  0   cyclobenzaprine (FLEXERIL) 5 MG tablet    Sig: Take 1 tablet by mouth before bed as needed for muscle spasm. Warning: May cause drowsiness.    Dispense:  7 tablet    Refill:  0   ketorolac (TORADOL) injection 60 mg   HOME CARE INSTRUCTIONS: For many people, back pain returns. Since low back pain is rarely dangerous, it is often a condition that people can learn to manage on their own. Please remain active. It is stressful on the back to sit or stand in one place. Do not sit, drive, or stand in one place for more than 30 minutes at a time. Take short walks on level surfaces as soon as pain allows. Try to increase the length of time you walk each day. Do not stay in bed. Resting more than 1 or 2 days can delay your recovery. Do not avoid exercise or work. Your body is made to move. It is not dangerous to be active, even though your back may hurt. Your back will likely heal faster if you return to being active before your pain is gone. Over-the-counter medicines to reduce pain and inflammation are often the most helpful.  SEEK MEDICAL CARE IF: You have pain that is not relieved with rest or medicine. You have pain that does not improve in 1 week. You have new symptoms. You are generally not feeling well.  SEEK IMMEDIATE MEDICAL CARE IF: You have pain that radiates from your back into your legs. You develop new bowel or bladder control problems. You have unusual weakness or numbness in your arms or legs. You develop nausea or vomiting. You develop abdominal pain. You feel faint.

## 2023-01-14 NOTE — ED Provider Notes (Signed)
[facility]   [id number] 01/12/23 Arrival Time: 4098  ASSESSMENT & PLAN:  1. Acute bilateral low back pain without sciatica    Able to ambulate here and hemodynamically stable. No indication for imaging of back at this time given no trauma and normal neurological exam. Discussed.  Meds ordered this encounter  Medications   predniSONE (DELTASONE) 20 MG tablet    Sig: Take 2 tablets (40 mg total) by mouth daily.    Dispense:  10 tablet    Refill:  0   cyclobenzaprine (FLEXERIL) 5 MG tablet    Sig: Take 1 tablet by mouth before bed as needed for muscle spasm. Warning: May cause drowsiness.    Dispense:  7 tablet    Refill:  0   ketorolac (TORADOL) injection 60 mg   Work/school excuse note: provided. Medication sedation precautions given. Encourage ROM/movement as tolerated.  U/A is normal.  Recommend:  Follow-up Information     [facility].   Specialty: Urgent Care Why: If worsening or failing to improve as anticipated. Contact information: [address] [place] [address] [phone]                Reviewed expectations re: course of current medical issues. Questions answered. Outlined signs and symptoms indicating need for more acute intervention. Patient verbalized understanding. After Visit Summary given.   SUBJECTIVE: History from: patient. Son interpreting/helping. They declines formal interpreting.  Gail Bruce is a 47 y.o. female who presents with complaint of left lower back pain x1 week. Pt reports lifting heavy objects at work. Pt has tried tylenol and aleve without relief. Pt is limping to triage room. Denies trauma. No extremity sensation changes or weakness. Denies abd pain. H/O similar back pain after lifting.  Reports no chronic steroid use, fevers, IV drug use, or recent back surgeries or procedures.    OBJECTIVE:  Vitals:   01/12/23 0910  BP: (!) 153/98  Pulse: 80  Resp:  16  Temp: 97.7 F (36.5 C)  TempSrc: Oral  SpO2: 98%    General appearance: alert; no distress HEENT: [place]; AT Neck: supple with FROM; without midline tenderness CV: regular Lungs: unlabored respirations; speaks full sentences without difficulty Abdomen: soft, non-tender; non-distended Back: mild  and poorly localized tenderness to palpation over bilat lumbar musculature ; FROM at waist; bruising: none; without midline tenderness Extremities: without edema; symmetrical without gross deformities; normal ROM of bilateral LE Skin: warm and dry Neurologic: normal gait; normal sensation and strength of bilateral LE Psychological: alert and cooperative; normal mood and affect  Labs: Results for orders placed or performed during the hospital encounter of 01/12/23  POC urinalysis dipstick  Result Value Ref Range   Color, UA yellow yellow   Clarity, UA clear clear   Glucose, UA negative negative mg/dL   Bilirubin, UA negative negative   Ketones, POC UA negative negative mg/dL   Spec Grav, UA 8.469 6.295 - 1.025   Blood, UA negative negative   pH, UA 7.0 5.0 - 8.0   Protein Ur, POC negative negative mg/dL   Urobilinogen, UA 0.2 0.2 or 1.0 E.U./dL   Nitrite, UA Negative Negative   Leukocytes, UA Negative Negative   Labs Reviewed  POCT URINALYSIS DIP (MANUAL ENTRY)    Imaging: No results found.  No Known Allergies  Past Medical History:  Diagnosis Date   Back pain    Chronic headache    Depression    Social History   Socioeconomic  History   Marital status: Married    Spouse name: Not on file   Number of children: Not on file   Years of education: Not on file   Highest education level: Not on file  Occupational History   Occupation: homemaker  Tobacco Use   Smoking status: Never   Smokeless tobacco: Never  Substance and Sexual Activity   Alcohol use: No   Drug use: No   Sexual activity: Not on file  Other Topics Concern   Not on file  Social History Narrative    Moved to [place] 2006?, husband works out of town, from [place], has 3 children ages 96yo, 18yo and 31 yo as of 05/2011.   Social Determinants of Health   Financial Resource Strain: Not on file  Food Insecurity: Not on file  Transportation Needs: Not on file  Physical Activity: Not on file  Stress: Not on file  Social Connections: Not on file  Intimate Partner Violence: Not on file   Family History  Problem Relation Age of Onset   Other Mother        unknown   Other Father        unknown   Past Surgical History:  Procedure Laterality Date   ABDOMINAL HYSTERECTOMY     due to infection?   CESAREAN SECTION     I & D EXTREMITY Right 01/16/2021   Procedure: 1.  Open reduction internal fixation right ring finger distal phalanx 2.  Complex closure right ring finger totaling 3 cm in length;  Surgeon: Allena Napoleon, MD;  Location: [facility];  Service: Plastics;  Laterality: Right;      Mardella Layman, MD 01/14/23 1009
